# Patient Record
Sex: Female | Born: 1955
Health system: Southern US, Community
[De-identification: ages and names within clinical notes are randomized; demographics above are authoritative.]

## PROBLEM LIST (undated history)

## (undated) DIAGNOSIS — F32A Depression, unspecified: Secondary | ICD-10-CM

## (undated) DIAGNOSIS — F419 Anxiety disorder, unspecified: Secondary | ICD-10-CM

## (undated) DIAGNOSIS — I10 Essential (primary) hypertension: Secondary | ICD-10-CM

## (undated) DIAGNOSIS — E785 Hyperlipidemia, unspecified: Secondary | ICD-10-CM

## (undated) HISTORY — DX: Anxiety disorder, unspecified: F41.9

## (undated) HISTORY — PX: UTERINE FIBROID SURGERY: SHX826

## (undated) HISTORY — DX: Depression, unspecified: F32.A

## (undated) HISTORY — DX: Hyperlipidemia, unspecified: E78.5

## (undated) HISTORY — DX: Essential (primary) hypertension: I10

---

## 2019-02-16 DIAGNOSIS — R350 Frequency of micturition: Secondary | ICD-10-CM | POA: Diagnosis not present

## 2019-02-16 DIAGNOSIS — G4700 Insomnia, unspecified: Secondary | ICD-10-CM | POA: Diagnosis not present

## 2019-06-01 DIAGNOSIS — Z20828 Contact with and (suspected) exposure to other viral communicable diseases: Secondary | ICD-10-CM | POA: Diagnosis not present

## 2019-06-17 DIAGNOSIS — Z20828 Contact with and (suspected) exposure to other viral communicable diseases: Secondary | ICD-10-CM | POA: Diagnosis not present

## 2019-08-02 ENCOUNTER — Ambulatory Visit: Payer: Self-pay | Admitting: Family Medicine

## 2019-08-29 DIAGNOSIS — Z20828 Contact with and (suspected) exposure to other viral communicable diseases: Secondary | ICD-10-CM | POA: Diagnosis not present

## 2019-09-07 ENCOUNTER — Ambulatory Visit: Payer: Self-pay | Admitting: Family Medicine

## 2019-09-21 ENCOUNTER — Ambulatory Visit (INDEPENDENT_AMBULATORY_CARE_PROVIDER_SITE_OTHER): Payer: Self-pay | Admitting: Family Medicine

## 2019-09-21 DIAGNOSIS — Z5329 Procedure and treatment not carried out because of patient's decision for other reasons: Secondary | ICD-10-CM

## 2019-09-21 NOTE — Progress Notes (Deleted)
Tabita Corbo is a 63 y.o. female  No chief complaint on file.   HPI: Charnese Federici is a 63 y.o. female here to establish care with our office and for her annual CPE with fasting labs. Pt also requests medication refills.  Last PAP: *** Last mammo: *** Last colonoscopy: ***  Diet/Exercise: ***   No past medical history on file.  *** The histories are not reviewed yet. Please review them in the "History" navigator section and refresh this Bra[place].  Social History   Socioeconomic History   Marital status: Divorced    Spouse name: Not on file   Number of children: Not on file   Years of education: Not on file   Highest education level: Not on file  Occupational History   Not on file  Social Needs   Financial resource strain: Not on file   Food insecurity    Worry: Not on file    Inability: Not on file   Transportation needs    Medical: Not on file    Non-medical: Not on file  Tobacco Use   Smoking status: Not on file  Substance and Sexual Activity   Alcohol use: Not on file   Drug use: Not on file   Sexual activity: Not on file  Lifestyle   Physical activity    Days per week: Not on file    Minutes per session: Not on file   Stress: Not on file  Relationships   Social connections    Talks on phone: Not on file    Gets together: Not on file    Attends religious service: Not on file    Active member of club or organization: Not on file    Attends meetings of clubs or organizations: Not on file    Relationship status: Not on file   Intimate partner violence    Fear of current or ex partner: Not on file    Emotionally abused: Not on file    Physically abused: Not on file    Forced sexual activity: Not on file  Other Topics Concern   Not on file  Social History Narrative   Not on file    No family history on file.    There is no immunization history on file for this patient.  No outpatient encounter medications on file as of  09/21/2019.   No facility-administered encounter medications on file as of 09/21/2019.      ROS: Gen: no fever, chills  Skin: no rash, itching ENT: no ear pain, ear drainage, nasal congestion, rhinorrhea, sinus pressure, sore throat Eyes: no blurry vision, double vision Resp: no cough, wheeze,SOB Breast: no breast tenderness, no nipple discharge, no breast masses CV: no CP, palpitations, LE edema,  GI: no heartburn, n/v/d/c, abd pain GU: no dysuria, urgency, frequency, hematuria MSK: no joint pain, myalgias, back pain Neuro: no dizziness, headache, weakness, vertigo Psych: no depression, anxiety, insomnia   Not on File  There were no vitals taken for this visit.  Physical Exam  Constitutional: She is oriented to person, place, and time. She appears well-developed and well-nourished. No distress.  HENT:  Head: Normocephalic and atraumatic.  Right Ear: Tympanic membrane and ear canal normal.  Left Ear: Tympanic membrane and ear canal normal.  Nose: Nose normal.  Mouth/Throat: Oropharynx is clear and moist and mucous membranes are normal.  Eyes: Pupils are equal, round, and reactive to light. Conjunctivae are normal.  Neck: Neck supple. No thyromegaly present.  Cardiovascular: Normal  rate, regular rhythm, normal heart sounds and intact distal pulses.  No murmur heard. Pulmonary/Chest: Effort normal and breath sounds normal. No respiratory distress. She has no wheezes. She has no rhonchi.  Abdominal: Soft. Bowel sounds are normal. She exhibits no distension and no mass. There is no abdominal tenderness.  Musculoskeletal:        General: No edema.  Lymphadenopathy:    She has no cervical adenopathy.  Neurological: She is alert and oriented to person, place, and time. She exhibits normal muscle tone. Coordination normal.  Skin: Skin is warm and dry.  Psychiatric: She has a normal mood and affect. Her behavior is normal.     A/P:

## 2020-04-18 ENCOUNTER — Other Ambulatory Visit: Payer: Self-pay

## 2020-04-19 ENCOUNTER — Encounter: Payer: Self-pay | Admitting: Family Medicine

## 2020-04-19 ENCOUNTER — Ambulatory Visit: Payer: BC Managed Care – PPO | Admitting: Family Medicine

## 2020-04-19 VITALS — BP 126/72 | HR 88 | Temp 96.0°F | Ht 62.0 in | Wt 252.6 lb

## 2020-04-19 DIAGNOSIS — F418 Other specified anxiety disorders: Secondary | ICD-10-CM | POA: Insufficient documentation

## 2020-04-19 DIAGNOSIS — Z0000 Encounter for general adult medical examination without abnormal findings: Secondary | ICD-10-CM | POA: Diagnosis not present

## 2020-04-19 DIAGNOSIS — I10 Essential (primary) hypertension: Secondary | ICD-10-CM | POA: Diagnosis not present

## 2020-04-19 DIAGNOSIS — E119 Type 2 diabetes mellitus without complications: Secondary | ICD-10-CM | POA: Insufficient documentation

## 2020-04-19 DIAGNOSIS — E041 Nontoxic single thyroid nodule: Secondary | ICD-10-CM | POA: Diagnosis not present

## 2020-04-19 DIAGNOSIS — F5101 Primary insomnia: Secondary | ICD-10-CM | POA: Insufficient documentation

## 2020-04-19 DIAGNOSIS — R011 Cardiac murmur, unspecified: Secondary | ICD-10-CM | POA: Insufficient documentation

## 2020-04-19 DIAGNOSIS — E1169 Type 2 diabetes mellitus with other specified complication: Secondary | ICD-10-CM | POA: Diagnosis not present

## 2020-04-19 DIAGNOSIS — Z1159 Encounter for screening for other viral diseases: Secondary | ICD-10-CM | POA: Diagnosis not present

## 2020-04-19 DIAGNOSIS — Z794 Long term (current) use of insulin: Secondary | ICD-10-CM | POA: Diagnosis not present

## 2020-04-19 MED ORDER — METFORMIN HCL 500 MG PO TABS: 1000.0000 mg | ORAL_TABLET | Freq: Every day | ORAL | 3 refills | Status: DC

## 2020-04-19 MED ORDER — ZOLPIDEM TARTRATE ER 12.5 MG PO TBCR: 12.5000 mg | EXTENDED_RELEASE_TABLET | Freq: Every evening | ORAL | 0 refills | Status: DC | PRN

## 2020-04-19 MED ORDER — LISINOPRIL-HYDROCHLOROTHIAZIDE 20-25 MG PO TABS: 1.0000 | ORAL_TABLET | Freq: Every day | ORAL | 3 refills | Status: DC

## 2020-04-19 MED ORDER — CITALOPRAM HYDROBROMIDE 20 MG PO TABS: 20.0000 mg | ORAL_TABLET | Freq: Every day | ORAL | 1 refills | Status: DC

## 2020-04-19 MED ORDER — LANTUS SOLOSTAR 100 UNIT/ML ~~LOC~~ SOPN: 65.0000 [IU] | PEN_INJECTOR | Freq: Every day | SUBCUTANEOUS | 5 refills | Status: DC

## 2020-04-19 MED ORDER — OZEMPIC (0.25 OR 0.5 MG/DOSE) 2 MG/1.5ML ~~LOC~~ SOPN: 1.0000 mg | PEN_INJECTOR | SUBCUTANEOUS | 3 refills | Status: DC

## 2020-04-19 NOTE — Progress Notes (Signed)
New Patient Office Visit  Subjective:  Patient ID: Jillian Fowler, female    DOB: 1956/04/16  Age: 64 y.o. MRN: [id number]  CC:  Chief Complaint  Patient presents with  . Establish Care    New patient, right ear pain x 1-2 weeks, would like BP and diabetes follwed.     HPI [name] presents for establishment of care and follow-up of her type 2 diabetes, hypertension, morbid obesity, depression with anxiety and insomnia.  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  Citalopram is helping her depression and anxiety a great deal.  She uses Ambien only as needed at night.  Uses it sparingly.  Less than 30 doses in the last several months.  Past Medical History:  Diagnosis Date  . Anxiety   . Depression   . Hyperlipidemia   . Hypertension     Past Surgical History:  Procedure Laterality Date  . UTERINE FIBROID SURGERY      Family History  Problem Relation Age of Onset  . Hypertension Mother   . Diabetes Mother   . Arthritis Mother   . Hypertension Father   . Diabetes Father     Social History   Socioeconomic History  . Marital status: Divorced    Spouse name: Not on file  . Number of children: Not on file  . Years of education: Not on file  . Highest education level: Not on file  Occupational History  . Not on file  Tobacco Use  . Smoking status: Never Smoker  . Smokeless tobacco: Never Used  Vaping Use  . Vaping Use: Never used  Substance and Sexual Activity  . Alcohol use: Never  . Drug use: Never  . Sexual activity: Not Currently  Other Topics Concern  . Not on file  Social History Narrative  . Not on file   Social Determinants of Health   Financial Resource Strain:   . Difficulty of Paying Living Expenses:   Food Insecurity:   . Worried About Charity fundraiser in the Last Year:   . Arboriculturist in the Last Year:   Transportation Needs:   . Film/video editor (Medical):   Marland Kitchen Lack of  Transportation (Non-Medical):   Physical Activity:   . Days of Exercise per Week:   . Minutes of Exercise per Session:   Stress:   . Feeling of Stress :   Social Connections:   . Frequency of Communication with Friends and Family:   . Frequency of Social Gatherings with Friends and Family:   . Attends Religious Services:   . Active Member of Clubs or Organizations:   . Attends Archivist Meetings:   Marland Kitchen Marital Status:   Intimate Partner Violence:   . Fear of Current or Ex-Partner:   . Emotionally Abused:   Marland Kitchen Physically Abused:   . Sexually Abused:     ROS Review of Systems  Constitutional: Negative.   HENT: Negative.   Eyes: Negative for photophobia and visual disturbance.  Respiratory: Negative.   Cardiovascular: Negative.   Gastrointestinal: Negative.   Endocrine: Negative for polyphagia and polyuria.  Genitourinary: Negative.   Musculoskeletal: Negative for gait problem and joint swelling.  Skin: Negative for pallor and rash.  Allergic/Immunologic: Negative for immunocompromised state.  Neurological: Negative for light-headedness and numbness.  Hematological: Does not bruise/bleed easily.   Depression screen [facility] 2/9 04/19/2020 04/19/2020  Decreased Interest 0 0  Down, Depressed, Hopeless 1  1  PHQ - 2 Score 1 1  Altered sleeping 1 -  Tired, decreased energy 3 -  Change in appetite 0 -  Feeling bad or failure about yourself  3 -  Trouble concentrating 0 -  Moving slowly or fidgety/restless 0 -  Suicidal thoughts 1 -  PHQ-9 Score 9 -    Objective:   Today's Vitals: BP 126/72   Pulse 88   Temp (!) 96 F (35.6 C) (Tympanic)   Ht 5\' 2"  (1.575 m)   Wt 252 lb 9.6 oz (114.6 kg)   SpO2 94%   BMI 46.20 kg/m   Physical Exam Vitals and nursing note reviewed.  Constitutional:      General: She is not in acute distress.    Appearance: Normal appearance. She is obese. She is not ill-appearing, toxic-appearing or diaphoretic.  HENT:     Head: Normocephalic  and atraumatic.     Right Ear: Tympanic membrane, ear canal and external ear normal.     Left Ear: Tympanic membrane, ear canal and external ear normal.     Mouth/Throat:     Mouth: Mucous membranes are moist.     Pharynx: No oropharyngeal exudate or posterior oropharyngeal erythema.  Eyes:     General: No scleral icterus.       Right eye: No discharge.        Left eye: No discharge.     Extraocular Movements: Extraocular movements intact.     Conjunctiva/sclera: Conjunctivae normal.     Pupils: Pupils are equal, round, and reactive to light.  Neck:     Thyroid: Thyroid mass present.  Cardiovascular:     Rate and Rhythm: Normal rate and regular rhythm.     Pulses:          Dorsalis pedis pulses are 2+ on the right side and 2+ on the left side.       Posterior tibial pulses are 2+ on the right side and 2+ on the left side.     Heart sounds: Murmur heard.  Systolic murmur is present with a grade of 2/6.   Pulmonary:     Effort: Pulmonary effort is normal.     Breath sounds: Normal breath sounds.  Abdominal:     General: Bowel sounds are normal.  Musculoskeletal:     Cervical back: No rigidity or tenderness.  Lymphadenopathy:     Cervical: No cervical adenopathy.  Skin:    General: Skin is warm and dry.  Neurological:     Mental Status: She is alert and oriented to person, place, and time.  Psychiatric:        Mood and Affect: Mood normal.        Behavior: Behavior normal.    Diabetic Foot Exam - Simple   Simple Foot Form Diabetic Foot exam was performed with the following findings: Yes 04/19/2020  4:41 PM  Visual Inspection No deformities, no ulcerations, no other skin breakdown bilaterally: Yes Sensation Testing Intact to touch and monofilament testing bilaterally: Yes Pulse Check Posterior Tibialis and Dorsalis pulse intact bilaterally: Yes Comments     Assessment & Plan:   Problem List Items Addressed This Visit      Cardiovascular and Mediastinum    Essential hypertension - Primary   Relevant Medications   lisinopril-hydrochlorothiazide (ZESTORETIC) 20-25 MG tablet   Other Relevant Orders   CBC   Comprehensive metabolic panel   Urinalysis, Routine w reflex microscopic   Microalbumin / creatinine urine ratio  Endocrine   Diabetes mellitus (HCC)   Relevant Medications   LANTUS SOLOSTAR 100 UNIT/ML Solostar Pen   lisinopril-hydrochlorothiazide (ZESTORETIC) 20-25 MG tablet   metFORMIN (GLUCOPHAGE) 500 MG tablet   OZEMPIC, 0.25 OR 0.5 MG/DOSE, 2 MG/1.5ML SOPN   Other Relevant Orders   Hemoglobin A1c   Urinalysis, Routine w reflex microscopic   Microalbumin / creatinine urine ratio   Ambulatory referral to Ophthalmology   Thyroid nodule   Relevant Orders   TSH   US THYROID     Other   Morbid obesity (HCC)   Relevant Medications   LANTUS SOLOSTAR 100 UNIT/ML Solostar Pen   metFORMIN (GLUCOPHAGE) 500 MG tablet   OZEMPIC, 0.25 OR 0.5 MG/DOSE, 2 MG/1.5ML SOPN   Other Relevant Orders   VITAMIN D 25 Hydroxy (Vit-D Deficiency, Fractures)   Amb Ref to Medical Weight Management   Heart murmur   Relevant Orders   Ambulatory referral to Cardiology   Depression with anxiety   Relevant Medications   citalopram (CELEXA) 20 MG tablet   Primary insomnia   Relevant Medications   zolpidem (AMBIEN CR) 12.5 MG CR tablet    Other Visit Diagnoses    Healthcare maintenance       Relevant Orders   LDL cholesterol, direct   Hepatitis C antibody   HIV Antibody (routine testing w rflx)   Lipid panel      Outpatient Encounter Medications as of 04/19/2020  Medication Sig  . citalopram (CELEXA) 20 MG tablet Take 1 tablet (20 mg total) by mouth daily.  Marland Kitchen LANTUS SOLOSTAR 100 UNIT/ML Solostar Pen Inject 65 Units into the skin at bedtime.  Marland Kitchen lisinopril-hydrochlorothiazide (ZESTORETIC) 20-25 MG tablet Take 1 tablet by mouth daily.  . metFORMIN (GLUCOPHAGE) 500 MG tablet Take 2 tablets (1,000 mg total) by mouth daily.  Marland Kitchen OZEMPIC, 0.25 OR  0.5 MG/DOSE, 2 MG/1.5ML SOPN Inject 0.75 mLs (1 mg total) into the skin once a week.  . zolpidem (AMBIEN CR) 12.5 MG CR tablet Take 1 tablet (12.5 mg total) by mouth at bedtime as needed for sleep.  . [DISCONTINUED] citalopram (CELEXA) 20 MG tablet Take 20 mg by mouth daily.  . [DISCONTINUED] LANTUS SOLOSTAR 100 UNIT/ML Solostar Pen Inject into the skin.  . [DISCONTINUED] lisinopril-hydrochlorothiazide (ZESTORETIC) 20-25 MG tablet Take 1 tablet by mouth daily.  . [DISCONTINUED] metFORMIN (GLUCOPHAGE) 500 MG tablet Take 1,000 mg by mouth daily.  . [DISCONTINUED] OZEMPIC, 0.25 OR 0.5 MG/DOSE, 2 MG/1.5ML SOPN Inject 0.5 mg into the skin once a week.  . [DISCONTINUED] zolpidem (AMBIEN CR) 12.5 MG CR tablet Take 12.5 mg by mouth at bedtime.   No facility-administered encounter medications on file as of 04/19/2020.    Follow-up: Return in about 3 months (around 07/20/2020).   Mliss Sax, MD

## 2020-04-22 LAB — URINALYSIS, ROUTINE W REFLEX MICROSCOPIC
Bilirubin Urine: NEGATIVE
Glucose, UA: NEGATIVE
Hgb urine dipstick: NEGATIVE
Ketones, ur: NEGATIVE
Leukocytes,Ua: NEGATIVE
Nitrite: NEGATIVE
Protein, ur: NEGATIVE
Specific Gravity, Urine: 1.02 (ref 1.001–1.03)
pH: 6 (ref 5.0–8.0)

## 2020-04-22 LAB — LIPID PANEL
Cholesterol: 246 mg/dL — ABNORMAL HIGH (ref <200)
HDL: 43 mg/dL — ABNORMAL LOW (ref >=50)
LDL Cholesterol (Calc): 173 mg/dL (calc) — ABNORMAL HIGH
Non-HDL Cholesterol (Calc): 203 mg/dL (calc) — ABNORMAL HIGH (ref <130)
Total CHOL/HDL Ratio: 5.7 (calc) — ABNORMAL HIGH (ref <5.0)
Triglycerides: 156 mg/dL — ABNORMAL HIGH (ref <150)

## 2020-04-22 LAB — CBC
HCT: 39 % (ref 35.0–45.0)
Hemoglobin: 12.6 g/dL (ref 11.7–15.5)
MCH: 23.1 pg — ABNORMAL LOW (ref 27.0–33.0)
MCHC: 32.3 g/dL (ref 32.0–36.0)
MCV: 71.6 fL — ABNORMAL LOW (ref 80.0–100.0)
MPV: 10.9 fL (ref 7.5–12.5)
Platelets: 268 10*3/uL (ref 140–400)
RBC: 5.45 10*6/uL — ABNORMAL HIGH (ref 3.80–5.10)
RDW: 14.2 % (ref 11.0–15.0)
WBC: 11.3 10*3/uL — ABNORMAL HIGH (ref 3.8–10.8)

## 2020-04-22 LAB — COMPREHENSIVE METABOLIC PANEL
AG Ratio: 1.7 (calc) (ref 1.0–2.5)
ALT: 13 U/L (ref 6–29)
AST: 12 U/L (ref 10–35)
Albumin: 4.1 g/dL (ref 3.6–5.1)
Alkaline phosphatase (APISO): 62 U/L (ref 37–153)
BUN/Creatinine Ratio: 20 (calc) (ref 6–22)
BUN: 20 mg/dL (ref 7–25)
CO2: 28 mmol/L (ref 20–32)
Calcium: 9.5 mg/dL (ref 8.6–10.4)
Chloride: 101 mmol/L (ref 98–110)
Creat: 1.01 mg/dL — ABNORMAL HIGH (ref 0.50–0.99)
Globulin: 2.4 g/dL (calc) (ref 1.9–3.7)
Glucose, Bld: 94 mg/dL (ref 65–99)
Potassium: 4.3 mmol/L (ref 3.5–5.3)
Sodium: 139 mmol/L (ref 135–146)
Total Bilirubin: 0.3 mg/dL (ref 0.2–1.2)
Total Protein: 6.5 g/dL (ref 6.1–8.1)

## 2020-04-22 LAB — HEMOGLOBIN A1C
Hgb A1c MFr Bld: 7.7 % of total Hgb — ABNORMAL HIGH (ref <5.7)
Mean Plasma Glucose: 174 (calc)
eAG (mmol/L): 9.7 (calc)

## 2020-04-22 LAB — HIV ANTIBODY (ROUTINE TESTING W REFLEX): HIV 1&2 Ab, 4th Generation: NONREACTIVE

## 2020-04-22 LAB — HEPATITIS C ANTIBODY
Hepatitis C Ab: NONREACTIVE
SIGNAL TO CUT-OFF: 0.01 (ref <1.00)

## 2020-04-22 LAB — VITAMIN D 25 HYDROXY (VIT D DEFICIENCY, FRACTURES): Vit D, 25-Hydroxy: 20 ng/mL — ABNORMAL LOW (ref 30–100)

## 2020-04-22 LAB — MICROALBUMIN / CREATININE URINE RATIO
Creatinine, Urine: 130 mg/dL (ref 20–275)
Microalb Creat Ratio: 6 mcg/mg creat (ref <30)
Microalb, Ur: 0.8 mg/dL

## 2020-04-22 LAB — TSH: TSH: 1.58 mIU/L (ref 0.40–4.50)

## 2020-04-22 LAB — LDL CHOLESTEROL, DIRECT: Direct LDL: 178 mg/dL — ABNORMAL HIGH (ref <100)

## 2020-04-29 ENCOUNTER — Encounter: Payer: Self-pay | Admitting: General Practice

## 2020-05-10 ENCOUNTER — Telehealth: Payer: Self-pay | Admitting: Family Medicine

## 2020-05-10 DIAGNOSIS — E1169 Type 2 diabetes mellitus with other specified complication: Secondary | ICD-10-CM

## 2020-05-10 NOTE — Telephone Encounter (Signed)
Patient is calling and wanted to speak to someone regarding dosage for ozempic, please advise. CB is [phone]

## 2020-05-15 ENCOUNTER — Other Ambulatory Visit: Payer: Self-pay

## 2020-05-15 DIAGNOSIS — Z794 Long term (current) use of insulin: Secondary | ICD-10-CM

## 2020-05-15 DIAGNOSIS — E1169 Type 2 diabetes mellitus with other specified complication: Secondary | ICD-10-CM

## 2020-05-15 NOTE — Telephone Encounter (Signed)
Patient calling states that she could not pick up Ozempic. I called and spoke with pharmacy and they informed me that the Rx needed to be 91ml instead of the dose that was sent in. I tried to change but was not able to do so. Please advise

## 2020-05-16 MED ORDER — OZEMPIC (0.25 OR 0.5 MG/DOSE) 2 MG/1.5ML ~~LOC~~ SOPN: 1.0000 mg | PEN_INJECTOR | SUBCUTANEOUS | 3 refills | Status: DC

## 2020-05-23 ENCOUNTER — Telehealth: Payer: Self-pay | Admitting: Family Medicine

## 2020-05-23 MED ORDER — OZEMPIC (1 MG/DOSE) 4 MG/3ML ~~LOC~~ SOPN: 1.0000 mg | PEN_INJECTOR | SUBCUTANEOUS | 3 refills | Status: DC

## 2020-05-23 NOTE — Telephone Encounter (Signed)
Per PCP - okay to change rx for the 98ml pen per pharmacy request. erx has been sent for 90 day and 3 refills.   Pt will continue (as previously prescribed) the 1 mg per week injection.

## 2020-05-23 NOTE — Telephone Encounter (Signed)
Patient states [facility] told her they no longer make Ozempic in the dosage prescribed. She is unaware what dosages they do carry. Please call pharmacy to clarify. [facility] on [address].

## 2020-05-28 ENCOUNTER — Other Ambulatory Visit: Payer: Self-pay

## 2020-05-28 ENCOUNTER — Ambulatory Visit (HOSPITAL_BASED_OUTPATIENT_CLINIC_OR_DEPARTMENT_OTHER)
Admission: RE | Admit: 2020-05-28 | Discharge: 2020-05-28 | Disposition: A | Discharge status: Home / Self Care | Payer: BC Managed Care – PPO | Source: Ambulatory Visit | Attending: Family Medicine | Admitting: Family Medicine

## 2020-05-28 DIAGNOSIS — E042 Nontoxic multinodular goiter: Secondary | ICD-10-CM | POA: Diagnosis not present

## 2020-05-28 DIAGNOSIS — E041 Nontoxic single thyroid nodule: Secondary | ICD-10-CM | POA: Diagnosis not present

## 2020-08-06 DIAGNOSIS — E119 Type 2 diabetes mellitus without complications: Secondary | ICD-10-CM | POA: Diagnosis not present

## 2020-08-20 DIAGNOSIS — H25812 Combined forms of age-related cataract, left eye: Secondary | ICD-10-CM | POA: Diagnosis not present

## 2020-10-25 ENCOUNTER — Other Ambulatory Visit: Payer: Self-pay | Admitting: Family Medicine

## 2020-10-25 DIAGNOSIS — Z794 Long term (current) use of insulin: Secondary | ICD-10-CM

## 2020-10-25 DIAGNOSIS — E1169 Type 2 diabetes mellitus with other specified complication: Secondary | ICD-10-CM

## 2020-11-19 ENCOUNTER — Other Ambulatory Visit: Payer: Self-pay | Admitting: Family Medicine

## 2020-11-19 ENCOUNTER — Other Ambulatory Visit: Payer: Self-pay | Admitting: Family

## 2020-11-19 DIAGNOSIS — Z794 Long term (current) use of insulin: Secondary | ICD-10-CM

## 2020-11-19 DIAGNOSIS — E1169 Type 2 diabetes mellitus with other specified complication: Secondary | ICD-10-CM

## 2020-11-19 DIAGNOSIS — F418 Other specified anxiety disorders: Secondary | ICD-10-CM

## 2020-11-22 ENCOUNTER — Telehealth: Payer: Self-pay

## 2020-11-22 NOTE — Telephone Encounter (Signed)
Patient calling to inform the provider that her insurance no longer pays for her insulin pen, Lantus and she called the pharmacy and she said that she was told that her insurance will pay for the Novolog flex pen.  Please send in the new rx.   Pt does not want to stick her finger everyday according to the insulin.  Please advise.  CB#[phone]

## 2020-11-22 NOTE — Telephone Encounter (Signed)
Patient called back and said that she found a coupon for Lantus and that she was going to try that first and see how much it it.  Pt will call back to let office know if she still would like to switch.

## 2020-12-04 ENCOUNTER — Telehealth: Payer: Self-pay

## 2020-12-04 MED ORDER — TOUJEO SOLOSTAR 300 UNIT/ML ~~LOC~~ SOPN: 65.0000 [IU] | PEN_INJECTOR | Freq: Every evening | SUBCUTANEOUS | 5 refills | Status: DC

## 2020-12-04 NOTE — Telephone Encounter (Signed)
Patient calling to request that her medication be changed due to  Insurance.  They will not pay for the Lantus nor the Basaglar.  Pt said that insurance will pay for the [facility] and wanted to know if it would be ok to switch insulins and if so to please send in.  I informed pt that Dr. Doreene Burke is not in office until March.  I will send message to provider of the day.  Please advise. CB# [phone]

## 2020-12-04 NOTE — Telephone Encounter (Signed)
New Rx sent to pharm on file

## 2020-12-04 NOTE — Telephone Encounter (Signed)
VM full

## 2020-12-23 ENCOUNTER — Other Ambulatory Visit: Payer: Self-pay | Admitting: Family

## 2020-12-23 DIAGNOSIS — F418 Other specified anxiety disorders: Secondary | ICD-10-CM

## 2020-12-30 ENCOUNTER — Other Ambulatory Visit: Payer: Self-pay | Admitting: Family Medicine

## 2020-12-30 DIAGNOSIS — Z1231 Encounter for screening mammogram for malignant neoplasm of breast: Secondary | ICD-10-CM

## 2020-12-31 HISTORY — PX: EYE SURGERY: SHX253

## 2021-02-11 ENCOUNTER — Other Ambulatory Visit: Payer: Self-pay

## 2021-02-11 ENCOUNTER — Ambulatory Visit
Admission: RE | Admit: 2021-02-11 | Discharge: 2021-02-11 | Disposition: A | Discharge status: Home / Self Care | Payer: BC Managed Care – PPO | Source: Ambulatory Visit

## 2021-02-11 DIAGNOSIS — Z1231 Encounter for screening mammogram for malignant neoplasm of breast: Secondary | ICD-10-CM

## 2021-03-14 ENCOUNTER — Other Ambulatory Visit: Payer: Self-pay | Admitting: Surgical Oncology

## 2021-03-14 DIAGNOSIS — K219 Gastro-esophageal reflux disease without esophagitis: Secondary | ICD-10-CM

## 2021-03-27 ENCOUNTER — Encounter: Payer: Self-pay | Admitting: Family Medicine

## 2021-03-27 ENCOUNTER — Ambulatory Visit (INDEPENDENT_AMBULATORY_CARE_PROVIDER_SITE_OTHER): Payer: No Typology Code available for payment source | Admitting: Family Medicine

## 2021-03-27 ENCOUNTER — Other Ambulatory Visit: Payer: Self-pay

## 2021-03-27 VITALS — BP 132/68 | HR 78 | Temp 97.0°F | Ht 62.0 in | Wt 252.2 lb

## 2021-03-27 DIAGNOSIS — E1169 Type 2 diabetes mellitus with other specified complication: Secondary | ICD-10-CM | POA: Diagnosis not present

## 2021-03-27 DIAGNOSIS — R011 Cardiac murmur, unspecified: Secondary | ICD-10-CM

## 2021-03-27 DIAGNOSIS — Z0000 Encounter for general adult medical examination without abnormal findings: Secondary | ICD-10-CM | POA: Diagnosis not present

## 2021-03-27 DIAGNOSIS — Z794 Long term (current) use of insulin: Secondary | ICD-10-CM

## 2021-03-27 DIAGNOSIS — F5101 Primary insomnia: Secondary | ICD-10-CM

## 2021-03-27 DIAGNOSIS — I10 Essential (primary) hypertension: Secondary | ICD-10-CM | POA: Diagnosis not present

## 2021-03-27 DIAGNOSIS — E7800 Pure hypercholesterolemia, unspecified: Secondary | ICD-10-CM

## 2021-03-27 NOTE — Progress Notes (Addendum)
Established Patient Office Visit  Subjective:  Patient ID: Jillian Fowler, female    DOB: 01-Jun-1956  Age: 65 y.o. MRN: [id number]  CC:  Chief Complaint  Patient presents with  . Annual Exam    CPE, no concerns. Not fasting.     HPI Jillian Fowler presents for follow-up of hypertension and diabetes.  Currently up to 65 units of Lantus nightly.  Doing well with the semaglutide.  Continues with high-dose metformin without any issue.  Status post dilated eye exam 1 March this year with cataract extraction later that month.  Blood pressures well controlled with lisinopril with HCTZ.  Depression controlled with Celexa.  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  Using Ambien sparingly.  Status post mammogram.  She is finally scheduled for an echocardiogram next week.  She has yet to schedule her Pap smear.  Has never had a colonoscopy.  Her big news is is that bariatric surgery is scheduled for her in August.  Past Medical History:  Diagnosis Date  . Anxiety   . Depression   . Hyperlipidemia   . Hypertension     Past Surgical History:  Procedure Laterality Date  . UTERINE FIBROID SURGERY      Family History  Problem Relation Age of Onset  . Hypertension Mother   . Diabetes Mother   . Arthritis Mother   . Hypertension Father   . Diabetes Father     Social History   Socioeconomic History  . Marital status: Divorced    Spouse name: Not on file  . Number of children: Not on file  . Years of education: Not on file  . Highest education level: Not on file  Occupational History  . Not on file  Tobacco Use  . Smoking status: Never Smoker  . Smokeless tobacco: Never Used  Vaping Use  . Vaping Use: Never used  Substance and Sexual Activity  . Alcohol use: Never  . Drug use: Never  . Sexual activity: Not Currently  Other Topics Concern  . Not on file  Social History Narrative  . Not on file    Social Determinants of Health   Financial Resource Strain: Not on file  Food Insecurity: Not on file  Transportation Needs: Not on file  Physical Activity: Not on file  Stress: Not on file  Social Connections: Not on file  Intimate Partner Violence: Not on file    Outpatient Medications Prior to Visit  Medication Sig Dispense Refill  . citalopram (CELEXA) 20 MG tablet Take 1 tablet by mouth once daily 90 tablet 0  . insulin glargine, 1 Unit Dial, (TOUJEO SOLOSTAR) 300 UNIT/ML Solostar Pen Inject 65 Units into the skin at bedtime. 1.5 mL 5  . metFORMIN (GLUCOPHAGE) 500 MG tablet Take 2 tablets (1,000 mg total) by mouth daily. 360 tablet 3  . lisinopril-hydrochlorothiazide (ZESTORETIC) 20-25 MG tablet Take 1 tablet by mouth daily. 90 tablet 3  . zolpidem (AMBIEN CR) 12.5 MG CR tablet Take 1 tablet (12.5 mg total) by mouth at bedtime as needed for sleep. (Patient not taking: Reported on 03/27/2021) 30 tablet 0  . Semaglutide, 1 MG/DOSE, (OZEMPIC, 1 MG/DOSE,) 4 MG/3ML SOPN Inject 0.75 mLs (1 mg total) into the skin once a week. (Patient not taking: Reported on 03/27/2021) 9 mL 3   No facility-administered medications prior to visit.    Not on File  ROS Review of Systems  Constitutional: Negative.  HENT: Negative.   Eyes: Negative for photophobia and visual disturbance.  Respiratory: Negative.   Cardiovascular: Negative.   Gastrointestinal: Negative.   Endocrine: Negative for polyphagia and polyuria.  Genitourinary: Negative.   Musculoskeletal: Negative for gait problem.  Neurological: Negative for weakness and numbness.      Objective:    Physical Exam Vitals and nursing note reviewed.  Constitutional:      General: She is not in acute distress.    Appearance: Normal appearance. She is obese. She is not ill-appearing, toxic-appearing or diaphoretic.  HENT:     Head: Normocephalic and atraumatic.     Right Ear: Tympanic membrane, ear canal and external ear normal.      Left Ear: Tympanic membrane, ear canal and external ear normal.     Mouth/Throat:     Mouth: Mucous membranes are moist.     Pharynx: No oropharyngeal exudate or posterior oropharyngeal erythema.  Eyes:     General: No scleral icterus.       Right eye: No discharge.        Left eye: No discharge.     Extraocular Movements: Extraocular movements intact.     Conjunctiva/sclera: Conjunctivae normal.     Pupils: Pupils are equal, round, and reactive to light.  Neck:     Vascular: No carotid bruit.  Cardiovascular:     Rate and Rhythm: Normal rate and regular rhythm.     Pulses:          Dorsalis pedis pulses are 2+ on the right side and 2+ on the left side.       Posterior tibial pulses are 2+ on the right side and 2+ on the left side.     Heart sounds: Murmur heard.    Pulmonary:     Effort: Pulmonary effort is normal.     Breath sounds: Normal breath sounds.  Abdominal:     General: Bowel sounds are normal.  Musculoskeletal:     Cervical back: No rigidity or tenderness.     Right lower leg: No edema.     Left lower leg: No edema.  Lymphadenopathy:     Cervical: No cervical adenopathy.  Skin:    General: Skin is warm and dry.  Neurological:     Mental Status: She is alert and oriented to person, place, and time.  Psychiatric:        Mood and Affect: Mood normal.        Behavior: Behavior normal.     Diabetic Foot Exam - Simple   Simple Foot Form Diabetic Foot exam was performed with the following findings: Yes 03/27/2021  3:37 PM  Visual Inspection No deformities, no ulcerations, no other skin breakdown bilaterally: Yes Sensation Testing Intact to touch and monofilament testing bilaterally: Yes Pulse Check Posterior Tibialis and Dorsalis pulse intact bilaterally: Yes Comments     BP 132/68   Pulse 78   Temp (!) 97 F (36.1 C) (Temporal)   Ht 5\' 2"  (1.575 m)   Wt 252 lb 3.2 oz (114.4 kg)   SpO2 97%   BMI 46.13 kg/m  Wt Readings from Last 3 Encounters:   03/27/21 252 lb 3.2 oz (114.4 kg)  04/19/20 252 lb 9.6 oz (114.6 kg)     Health Maintenance Due  Topic Date Due  . PNEUMOCOCCAL POLYSACCHARIDE VACCINE AGE 20-64 HIGH RISK  Never done  . Pneumococcal Vaccine 50-95 Years old (1 of 2 - PPSV23) Never done  . TETANUS/TDAP  Never done  .  PAP SMEAR-Modifier  Never done  . Zoster Vaccines- Shingrix (1 of 2) Never done    There are no preventive care reminders to display for this patient.  Lab Results  Component Value Date   TSH 1.58 04/19/2020   Lab Results  Component Value Date   WBC 9.2 04/04/2021   HGB 12.5 04/04/2021   HCT 38.8 04/04/2021   MCV 71.9 (L) 04/04/2021   PLT 222.0 04/04/2021   Lab Results  Component Value Date   NA 139 04/04/2021   K 4.0 04/04/2021   CO2 27 04/04/2021   GLUCOSE 103 (H) 04/04/2021   BUN 21 04/04/2021   CREATININE 0.76 04/04/2021   BILITOT 0.4 04/04/2021   ALKPHOS 57 04/04/2021   AST 12 04/04/2021   ALT 11 04/04/2021   PROT 6.6 04/04/2021   ALBUMIN 4.0 04/04/2021   CALCIUM 9.3 04/04/2021   GFR 83.37 04/04/2021   Lab Results  Component Value Date   CHOL 247 (H) 04/04/2021   Lab Results  Component Value Date   HDL 42.40 04/04/2021   Lab Results  Component Value Date   LDLCALC 182 (H) 04/04/2021   Lab Results  Component Value Date   TRIG 114.0 04/04/2021   Lab Results  Component Value Date   CHOLHDL 6 04/04/2021   Lab Results  Component Value Date   HGBA1C 8.8 (H) 04/04/2021      Assessment & Plan:   Problem List Items Addressed This Visit      Cardiovascular and Mediastinum   Essential hypertension   Relevant Medications   rosuvastatin (CRESTOR) 20 MG tablet   Other Relevant Orders   Basic metabolic panel (Completed)   Urinalysis, Routine w reflex microscopic (Completed)   Microalbumin / creatinine urine ratio (Completed)     Endocrine   Diabetes mellitus (HCC) - Primary   Relevant Medications   rosuvastatin (CRESTOR) 20 MG tablet   sitaGLIPtin (JANUVIA) 50  MG tablet   Other Relevant Orders   Basic metabolic panel (Completed)   Hemoglobin A1c (Completed)   Urinalysis, Routine w reflex microscopic (Completed)   Microalbumin / creatinine urine ratio (Completed)     Other   Morbid obesity (HCC)   Relevant Medications   sitaGLIPtin (JANUVIA) 50 MG tablet   Heart murmur   Primary insomnia    Other Visit Diagnoses    Healthcare maintenance       Relevant Orders   CBC (Completed)   LDL cholesterol, direct (Completed)   Hepatic function panel (Completed)   Lipid panel (Completed)   Elevated LDL cholesterol level       Relevant Medications   rosuvastatin (CRESTOR) 20 MG tablet      Meds ordered this encounter  Medications  . rosuvastatin (CRESTOR) 20 MG tablet    Sig: Take 1 tablet (20 mg total) by mouth daily.    Dispense:  90 tablet    Refill:  3  . sitaGLIPtin (JANUVIA) 50 MG tablet    Sig: Take 1 tablet (50 mg total) by mouth daily.    Dispense:  90 tablet    Refill:  1    Follow-up: Return in about 3 months (around 06/27/2021), or return fasting for blood work. Good luck with bariatric surgery..   Continue current medicines as above.  She will return fasting for ordered blood work.  Wished her luck with upcoming bariatric surgery.  She is still due for a Pap smear and said that she would follow-up with a GYN  provider for that exam.  Given information on health maintenance and disease prevention.  Advised that I was looking forward to the weight that she will most likely lose along with some of her medicines as well. Mliss SaxWilliam Alfred Kashawn Manzano, MD   6/6 addendum: LDL cholesterol is significantly elevated and her diabetes is under poor control.  Have added Crestor 20 mg and Januvia 50 mg.  Advised to report any unusual muscle aches or pains.  Advised to return in 3 months instead of 6.

## 2021-03-27 NOTE — Patient Instructions (Signed)
Health Maintenance, Female Adopting a healthy lifestyle and getting preventive care are important in promoting health and wellness. Ask your health care provider about:  The right schedule for you to have regular tests and exams.  Things you can do on your own to prevent diseases and keep yourself healthy. What should I know about diet, weight, and exercise? Eat a healthy diet  Eat a diet that includes plenty of vegetables, fruits, low-fat dairy products, and lean protein.  Do not eat a lot of foods that are high in solid fats, added sugars, or sodium.   Maintain a healthy weight Body mass index (BMI) is used to identify weight problems. It estimates body fat based on height and weight. Your health care provider can help determine your BMI and help you achieve or maintain a healthy weight. Get regular exercise Get regular exercise. This is one of the most important things you can do for your health. Most adults should:  Exercise for at least 150 minutes each week. The exercise should increase your heart rate and make you sweat (moderate-intensity exercise).  Do strengthening exercises at least twice a week. This is in addition to the moderate-intensity exercise.  Spend less time sitting. Even light physical activity can be beneficial. Watch cholesterol and blood lipids Have your blood tested for lipids and cholesterol at 64 years of age, then have this test every 5 years. Have your cholesterol levels checked more often if:  Your lipid or cholesterol levels are high.  You are older than 64 years of age.  You are at high risk for heart disease. What should I know about cancer screening? Depending on your health history and family history, you may need to have cancer screening at various ages. This may include screening for:  Breast cancer.  Cervical cancer.  Colorectal cancer.  Skin cancer.  Lung cancer. What should I know about heart disease, diabetes, and high blood  pressure? Blood pressure and heart disease  High blood pressure causes heart disease and increases the risk of stroke. This is more likely to develop in people who have high blood pressure readings, are of African descent, or are overweight.  Have your blood pressure checked: ? Every 3-5 years if you are 64 years of age. ? Every year if you are 64 years old or older. Diabetes Have regular diabetes screenings. This checks your fasting blood sugar level. Have the screening done:  Once every three years after age 64 if you are at a normal weight and have a low risk for diabetes.  More often and at a younger age if you are overweight or have a high risk for diabetes. What should I know about preventing infection? Hepatitis B If you have a higher risk for hepatitis B, you should be screened for this virus. Talk with your health care provider to find out if you are at risk for hepatitis B infection. Hepatitis C Testing is recommended for:  Everyone born from [birth year] through 1964.  Anyone with known risk factors for hepatitis C. Sexually transmitted infections (STIs)  Get screened for STIs, including gonorrhea and chlamydia, if: ? You are sexually active and are younger than 64 years of age. ? You are older than 64 years of age and your health care provider tells you that you are at risk for this type of infection. ? Your sexual activity has changed since you were last screened, and you are at increased risk for chlamydia or gonorrhea. Ask your health care provider  if you are at risk.  Ask your health care provider about whether you are at high risk for HIV. Your health care provider may recommend a prescription medicine to help prevent HIV infection. If you choose to take medicine to prevent HIV, you should first get tested for HIV. You should then be tested every 3 months for as long as you are taking the medicine. Pregnancy  If you are about to stop having your period (premenopausal) and  you may become pregnant, seek counseling before you get pregnant.  Take 400 to 800 micrograms (mcg) of folic acid every day if you become pregnant.  Ask for birth control (contraception) if you want to prevent pregnancy. Osteoporosis and menopause Osteoporosis is a disease in which the bones lose minerals and strength with aging. This can result in bone fractures. If you are 64 years old or older, or if you are at risk for osteoporosis and fractures, ask your health care provider if you should:  Be screened for bone loss.  Take a calcium or vitamin D supplement to lower your risk of fractures.  Be given hormone replacement therapy (HRT) to treat symptoms of menopause. Follow these instructions at home: Lifestyle  Do not use any products that contain nicotine or tobacco, such as cigarettes, e-cigarettes, and chewing tobacco. If you need help quitting, ask your health care provider.  Do not use street drugs.  Do not share needles.  Ask your health care provider for help if you need support or information about quitting drugs. Alcohol use  Do not drink alcohol if: ? Your health care provider tells you not to drink. ? You are pregnant, may be pregnant, or are planning to become pregnant.  If you drink alcohol: ? Limit how much you use to 0-1 drink a day. ? Limit intake if you are breastfeeding.  Be aware of how much alcohol is in your drink. In the [place] one drink equals one 12 oz bottle of beer (355 mL), one 5 oz glass of wine (148 mL), or one 1 oz glass of hard liquor (44 mL). General instructions  Schedule regular health, dental, and eye exams.  Stay current with your vaccines.  Tell your health care provider if: ? You often feel depressed. ? You have ever been abused or do not feel safe at home. Summary  Adopting a healthy lifestyle and getting preventive care are important in promoting health and wellness.  Follow your health care provider's instructions about healthy  diet, exercising, and getting tested or screened for diseases.  Follow your health care provider's instructions on monitoring your cholesterol and blood pressure. This information is not intended to replace advice given to you by your health care provider. Make sure you discuss any questions you have with your health care provider. Document Revised: 10/12/2018 Document Reviewed: 10/12/2018 Elsevier Patient Education  2021 [employer]  Preventive Care 54-51 Years Old, Female Preventive care refers to lifestyle choices and visits with your health care provider that can promote health and wellness. This includes:  A yearly physical exam. This is also called an annual wellness visit.  Regular dental and eye exams.  Immunizations.  Screening for certain conditions.  Healthy lifestyle choices, such as: ? Eating a healthy diet. ? Getting regular exercise. ? Not using drugs or products that contain nicotine and tobacco. ? Limiting alcohol use. What can I expect for my preventive care visit? Physical exam Your health care provider will check your:  Height and weight. These may  be used to calculate your BMI (body mass index). BMI is a measurement that tells if you are at a healthy weight.  Heart rate and blood pressure.  Body temperature.  Skin for abnormal spots. Counseling Your health care provider may ask you questions about your:  Past medical problems.  Family's medical history.  Alcohol, tobacco, and drug use.  Emotional well-being.  Home life and relationship well-being.  Sexual activity.  Diet, exercise, and sleep habits.  Work and work Statistician.  Access to firearms.  Method of birth control.  Menstrual cycle.  Pregnancy history. What immunizations do I need? Vaccines are usually given at various ages, according to a schedule. Your health care provider will recommend vaccines for you based on your age, medical history, and lifestyle or other factors,  such as travel or where you work.   What tests do I need? Blood tests  Lipid and cholesterol levels. These may be checked every 5 years, or more often if you are over 65 years old.  Hepatitis C test.  Hepatitis B test. Screening  Lung cancer screening. You may have this screening every year starting at age 90 if you have a 30-pack-year history of smoking and currently smoke or have quit within the past 15 years.  Colorectal cancer screening. ? All adults should have this screening starting at age 16 and continuing until age 38. ? Your health care provider may recommend screening at age 30 if you are at increased risk. ? You will have tests every 1-10 years, depending on your results and the type of screening test.  Diabetes screening. ? This is done by checking your blood sugar (glucose) after you have not eaten for a while (fasting). ? You may have this done every 1-3 years.  Mammogram. ? This may be done every 1-2 years. ? Talk with your health care provider about when you should start having regular mammograms. This may depend on whether you have a family history of breast cancer.  BRCA-related cancer screening. This may be done if you have a family history of breast, ovarian, tubal, or peritoneal cancers.  Pelvic exam and Pap test. ? This may be done every 3 years starting at age 58. ? Starting at age 60, this may be done every 5 years if you have a Pap test in combination with an HPV test. Other tests  STD (sexually transmitted disease) testing, if you are at risk.  Bone density scan. This is done to screen for osteoporosis. You may have this scan if you are at high risk for osteoporosis. Talk with your health care provider about your test results, treatment options, and if necessary, the need for more tests. Follow these instructions at home: Eating and drinking  Eat a diet that includes fresh fruits and vegetables, whole grains, lean protein, and low-fat dairy  products.  Take vitamin and mineral supplements as recommended by your health care provider.  Do not drink alcohol if: ? Your health care provider tells you not to drink. ? You are pregnant, may be pregnant, or are planning to become pregnant.  If you drink alcohol: ? Limit how much you have to 0-1 drink a day. ? Be aware of how much alcohol is in your drink. In the [place] one drink equals one 12 oz bottle of beer (355 mL), one 5 oz glass of wine (148 mL), or one 1 oz glass of hard liquor (44 mL).   Lifestyle  Take daily care of your teeth and  gums. Brush your teeth every morning and night with fluoride toothpaste. Floss one time each day.  Stay active. Exercise for at least 30 minutes 5 or more days each week.  Do not use any products that contain nicotine or tobacco, such as cigarettes, e-cigarettes, and chewing tobacco. If you need help quitting, ask your health care provider.  Do not use drugs.  If you are sexually active, practice safe sex. Use a condom or other form of protection to prevent STIs (sexually transmitted infections).  If you do not wish to become pregnant, use a form of birth control. If you plan to become pregnant, see your health care provider for a prepregnancy visit.  If told by your health care provider, take low-dose aspirin daily starting at age 61.  Find healthy ways to cope with stress, such as: ? Meditation, yoga, or listening to music. ? Journaling. ? Talking to a trusted person. ? Spending time with friends and family. Safety  Always wear your seat belt while driving or riding in a vehicle.  Do not drive: ? If you have been drinking alcohol. Do not ride with someone who has been drinking. ? When you are tired or distracted. ? While texting.  Wear a helmet and other protective equipment during sports activities.  If you have firearms in your house, make sure you follow all gun safety procedures. What's next?  Visit your health care provider  once a year for an annual wellness visit.  Ask your health care provider how often you should have your eyes and teeth checked.  Stay up to date on all vaccines. This information is not intended to replace advice given to you by your health care provider. Make sure you discuss any questions you have with your health care provider. Document Revised: 07/23/2020 Document Reviewed: 06/30/2018 Elsevier Patient Education  2021 [employer].

## 2021-03-30 ENCOUNTER — Other Ambulatory Visit: Payer: Self-pay | Admitting: Family Medicine

## 2021-03-30 DIAGNOSIS — E1169 Type 2 diabetes mellitus with other specified complication: Secondary | ICD-10-CM

## 2021-03-30 DIAGNOSIS — I10 Essential (primary) hypertension: Secondary | ICD-10-CM

## 2021-03-30 DIAGNOSIS — Z794 Long term (current) use of insulin: Secondary | ICD-10-CM

## 2021-04-04 ENCOUNTER — Other Ambulatory Visit: Payer: Self-pay

## 2021-04-04 ENCOUNTER — Other Ambulatory Visit (INDEPENDENT_AMBULATORY_CARE_PROVIDER_SITE_OTHER): Payer: No Typology Code available for payment source

## 2021-04-04 DIAGNOSIS — I10 Essential (primary) hypertension: Secondary | ICD-10-CM | POA: Diagnosis not present

## 2021-04-04 DIAGNOSIS — Z794 Long term (current) use of insulin: Secondary | ICD-10-CM | POA: Diagnosis not present

## 2021-04-04 DIAGNOSIS — Z0000 Encounter for general adult medical examination without abnormal findings: Secondary | ICD-10-CM

## 2021-04-04 DIAGNOSIS — E1169 Type 2 diabetes mellitus with other specified complication: Secondary | ICD-10-CM

## 2021-04-04 LAB — URINALYSIS, ROUTINE W REFLEX MICROSCOPIC
Bilirubin Urine: NEGATIVE
Hgb urine dipstick: NEGATIVE
Ketones, ur: NEGATIVE
Leukocytes,Ua: NEGATIVE
Nitrite: NEGATIVE
Specific Gravity, Urine: 1.02 (ref 1.000–1.030)
Total Protein, Urine: NEGATIVE
Urine Glucose: NEGATIVE
Urobilinogen, UA: 0.2 (ref 0.0–1.0)
pH: 5.5 (ref 5.0–8.0)

## 2021-04-04 LAB — CBC
HCT: 38.8 % (ref 36.0–46.0)
Hemoglobin: 12.5 g/dL (ref 12.0–15.0)
MCHC: 32.2 g/dL (ref 30.0–36.0)
MCV: 71.9 fl — ABNORMAL LOW (ref 78.0–100.0)
Platelets: 222 10*3/uL (ref 150.0–400.0)
RBC: 5.39 Mil/uL — ABNORMAL HIGH (ref 3.87–5.11)
RDW: 14 % (ref 11.5–15.5)
WBC: 9.2 10*3/uL (ref 4.0–10.5)

## 2021-04-04 LAB — HEMOGLOBIN A1C: Hgb A1c MFr Bld: 8.8 % — ABNORMAL HIGH (ref 4.6–6.5)

## 2021-04-04 NOTE — Progress Notes (Signed)
Per orders of Dr. Doreene Burke pt is here. For labs pt tolerated draw well.

## 2021-04-07 LAB — HEPATIC FUNCTION PANEL
ALT: 11 U/L (ref 0–35)
AST: 12 U/L (ref 0–37)
Albumin: 4 g/dL (ref 3.5–5.2)
Alkaline Phosphatase: 57 U/L (ref 39–117)
Bilirubin, Direct: 0.1 mg/dL (ref 0.0–0.3)
Total Bilirubin: 0.4 mg/dL (ref 0.2–1.2)
Total Protein: 6.6 g/dL (ref 6.0–8.3)

## 2021-04-07 LAB — LIPID PANEL
Cholesterol: 247 mg/dL — ABNORMAL HIGH (ref 0–200)
HDL: 42.4 mg/dL (ref >39.00)
LDL Cholesterol: 182 mg/dL — ABNORMAL HIGH (ref 0–99)
NonHDL: 204.61
Total CHOL/HDL Ratio: 6
Triglycerides: 114 mg/dL (ref 0.0–149.0)
VLDL: 22.8 mg/dL (ref 0.0–40.0)

## 2021-04-07 LAB — BASIC METABOLIC PANEL
BUN: 21 mg/dL (ref 6–23)
CO2: 27 mEq/L (ref 19–32)
Calcium: 9.3 mg/dL (ref 8.4–10.5)
Chloride: 102 mEq/L (ref 96–112)
Creatinine, Ser: 0.76 mg/dL (ref 0.40–1.20)
GFR: 83.37 mL/min (ref >60.00)
Glucose, Bld: 103 mg/dL — ABNORMAL HIGH (ref 70–99)
Potassium: 4 mEq/L (ref 3.5–5.1)
Sodium: 139 mEq/L (ref 135–145)

## 2021-04-07 LAB — MICROALBUMIN / CREATININE URINE RATIO
Creatinine,U: 118.8 mg/dL
Microalb Creat Ratio: 0.7 mg/g (ref 0.0–30.0)
Microalb, Ur: 0.8 mg/dL (ref 0.0–1.9)

## 2021-04-07 LAB — LDL CHOLESTEROL, DIRECT: Direct LDL: 172 mg/dL

## 2021-04-07 MED ORDER — SITAGLIPTIN PHOSPHATE 50 MG PO TABS: 50.0000 mg | ORAL_TABLET | Freq: Every day | ORAL | 1 refills | Status: DC

## 2021-04-07 MED ORDER — ROSUVASTATIN CALCIUM 20 MG PO TABS: 20.0000 mg | ORAL_TABLET | Freq: Every day | ORAL | 3 refills | Status: DC

## 2021-04-07 NOTE — Addendum Note (Signed)
Addended by: Andrez Grime on: 04/07/2021 03:44 PM   Modules accepted: Orders

## 2021-04-08 ENCOUNTER — Ambulatory Visit
Admission: RE | Admit: 2021-04-08 | Discharge: 2021-04-08 | Disposition: A | Discharge status: Home / Self Care | Payer: No Typology Code available for payment source | Source: Ambulatory Visit | Attending: Surgical Oncology | Admitting: Surgical Oncology

## 2021-04-08 ENCOUNTER — Other Ambulatory Visit: Payer: Self-pay | Admitting: Surgical Oncology

## 2021-04-08 DIAGNOSIS — K219 Gastro-esophageal reflux disease without esophagitis: Secondary | ICD-10-CM

## 2021-04-25 ENCOUNTER — Other Ambulatory Visit: Payer: Self-pay | Admitting: Family Medicine

## 2021-04-28 NOTE — Telephone Encounter (Signed)
Last fill 12/04/20  #1.63ml/5 Last OV 03/30/21

## 2021-05-16 ENCOUNTER — Other Ambulatory Visit: Payer: Self-pay | Admitting: Family Medicine

## 2021-05-16 ENCOUNTER — Other Ambulatory Visit: Payer: Self-pay | Admitting: Family

## 2021-05-16 DIAGNOSIS — E1169 Type 2 diabetes mellitus with other specified complication: Secondary | ICD-10-CM

## 2021-05-16 DIAGNOSIS — F418 Other specified anxiety disorders: Secondary | ICD-10-CM

## 2021-05-21 ENCOUNTER — Other Ambulatory Visit: Payer: Self-pay | Admitting: Family Medicine

## 2021-05-26 ENCOUNTER — Telehealth: Payer: Self-pay

## 2021-05-26 DIAGNOSIS — F418 Other specified anxiety disorders: Secondary | ICD-10-CM

## 2021-05-26 NOTE — Telephone Encounter (Signed)
Received a refill request for: Citalopram 20 mg LR 12/24/20, # 90, 1 refill LOV 03/27/21 FOV 09/21/21  Please review and advise.  Thanks. Dm/cma

## 2021-05-27 MED ORDER — CITALOPRAM HYDROBROMIDE 20 MG PO TABS: 20.0000 mg | ORAL_TABLET | Freq: Every day | ORAL | 0 refills | Status: DC

## 2021-05-27 NOTE — Addendum Note (Signed)
Addended by: Waymond Cera on: 05/27/2021 10:08 AM   Modules accepted: Orders

## 2021-05-27 NOTE — Telephone Encounter (Signed)
Patient notified VIA phone and appt scheduled for 810 @ 8:30 am. Dm/cma

## 2021-06-11 ENCOUNTER — Ambulatory Visit (INDEPENDENT_AMBULATORY_CARE_PROVIDER_SITE_OTHER): Payer: No Typology Code available for payment source | Admitting: Family Medicine

## 2021-06-11 ENCOUNTER — Other Ambulatory Visit: Payer: Self-pay

## 2021-06-11 ENCOUNTER — Encounter: Payer: Self-pay | Admitting: Family Medicine

## 2021-06-11 VITALS — BP 116/68 | HR 72 | Temp 97.1°F | Ht 62.0 in | Wt 246.0 lb

## 2021-06-11 DIAGNOSIS — E7800 Pure hypercholesterolemia, unspecified: Secondary | ICD-10-CM

## 2021-06-11 DIAGNOSIS — Z0000 Encounter for general adult medical examination without abnormal findings: Secondary | ICD-10-CM

## 2021-06-11 DIAGNOSIS — Z794 Long term (current) use of insulin: Secondary | ICD-10-CM

## 2021-06-11 DIAGNOSIS — I10 Essential (primary) hypertension: Secondary | ICD-10-CM

## 2021-06-11 DIAGNOSIS — E1169 Type 2 diabetes mellitus with other specified complication: Secondary | ICD-10-CM | POA: Diagnosis not present

## 2021-06-11 LAB — LIPID PANEL
Cholesterol: 178 mg/dL (ref 0–200)
HDL: 44.9 mg/dL (ref >39.00)
LDL Cholesterol: 120 mg/dL — ABNORMAL HIGH (ref 0–99)
NonHDL: 133.2
Total CHOL/HDL Ratio: 4
Triglycerides: 67 mg/dL (ref 0.0–149.0)
VLDL: 13.4 mg/dL (ref 0.0–40.0)

## 2021-06-11 LAB — COMPREHENSIVE METABOLIC PANEL
ALT: 11 U/L (ref 0–35)
AST: 13 U/L (ref 0–37)
Albumin: 4 g/dL (ref 3.5–5.2)
Alkaline Phosphatase: 55 U/L (ref 39–117)
BUN: 15 mg/dL (ref 6–23)
CO2: 28 mEq/L (ref 19–32)
Calcium: 9 mg/dL (ref 8.4–10.5)
Chloride: 104 mEq/L (ref 96–112)
Creatinine, Ser: 0.77 mg/dL (ref 0.40–1.20)
GFR: 81.97 mL/min (ref >60.00)
Glucose, Bld: 62 mg/dL — ABNORMAL LOW (ref 70–99)
Potassium: 3.8 mEq/L (ref 3.5–5.1)
Sodium: 141 mEq/L (ref 135–145)
Total Bilirubin: 0.4 mg/dL (ref 0.2–1.2)
Total Protein: 6.5 g/dL (ref 6.0–8.3)

## 2021-06-11 LAB — CBC
HCT: 38 % (ref 36.0–46.0)
Hemoglobin: 12 g/dL (ref 12.0–15.0)
MCHC: 31.6 g/dL (ref 30.0–36.0)
MCV: 72.3 fl — ABNORMAL LOW (ref 78.0–100.0)
Platelets: 212 10*3/uL (ref 150.0–400.0)
RBC: 5.25 Mil/uL — ABNORMAL HIGH (ref 3.87–5.11)
RDW: 14.1 % (ref 11.5–15.5)
WBC: 8.3 10*3/uL (ref 4.0–10.5)

## 2021-06-11 LAB — URINALYSIS, ROUTINE W REFLEX MICROSCOPIC
Bilirubin Urine: NEGATIVE
Hgb urine dipstick: NEGATIVE
Ketones, ur: NEGATIVE
Leukocytes,Ua: NEGATIVE
Nitrite: NEGATIVE
RBC / HPF: NONE SEEN (ref 0–?)
Specific Gravity, Urine: 1.025 (ref 1.000–1.030)
Total Protein, Urine: NEGATIVE
Urine Glucose: NEGATIVE
Urobilinogen, UA: 0.2 (ref 0.0–1.0)
pH: 5.5 (ref 5.0–8.0)

## 2021-06-11 LAB — HEMOGLOBIN A1C: Hgb A1c MFr Bld: 9 % — ABNORMAL HIGH (ref 4.6–6.5)

## 2021-06-11 MED ORDER — SIMVASTATIN 20 MG PO TABS: 20.0000 mg | ORAL_TABLET | Freq: Every day | ORAL | 3 refills | Status: DC

## 2021-06-11 NOTE — Progress Notes (Signed)
Established Patient Office Visit  Subjective:  Patient ID: Jillian Fowler, female    DOB: 1956-05-09  Age: 65 y.o. MRN: [id number]  CC:  Chief Complaint  Patient presents with   Follow-up    Follow up on cholesterol patient fasting for labs. Per patient she had to stop taking cholesterol meds due to leg cramps about 2 weeks ago.     HPI Jillian Fowler presents for follow-up of elevated ldl cholesterol, poorly controlled diabetes, hypertension, morbid obesity and health maintenance.  Has not had a Pap smear.  She has been cleared for bariatric surgery.  Blood pressure is well controlled on current regimen.  Had recommended Januvia after last visit but it was never started.  She assures me that she is reduce the carbohydrates in her diet.  Developed myalgias with rosuvastatin and discontinued it.  Past Medical History:  Diagnosis Date   Anxiety    Depression    Hyperlipidemia    Hypertension     Past Surgical History:  Procedure Laterality Date   UTERINE FIBROID SURGERY      Family History  Problem Relation Age of Onset   Hypertension Mother    Diabetes Mother    Arthritis Mother    Hypertension Father    Diabetes Father     Social History   Socioeconomic History   Marital status: Divorced    Spouse name: Not on file   Number of children: Not on file   Years of education: Not on file   Highest education level: Not on file  Occupational History   Not on file  Tobacco Use   Smoking status: Never   Smokeless tobacco: Never  Vaping Use   Vaping Use: Never used  Substance and Sexual Activity   Alcohol use: Never   Drug use: Never   Sexual activity: Not Currently  Other Topics Concern   Not on file  Social History Narrative   Not on file   Social Determinants of Health   Financial Resource Strain: Not on file  Food Insecurity: Not on file  Transportation Needs: Not on file  Physical Activity: Not on file  Stress: Not on file  Social Connections: Not on  file  Intimate Partner Violence: Not on file    Outpatient Medications Prior to Visit  Medication Sig Dispense Refill   citalopram (CELEXA) 20 MG tablet Take 1 tablet (20 mg total) by mouth daily. 90 tablet 0   lisinopril-hydrochlorothiazide (ZESTORETIC) 20-25 MG tablet Take 1 tablet by mouth once daily 90 tablet 1   metFORMIN (GLUCOPHAGE) 500 MG tablet Take 2 tablets by mouth once daily 180 tablet 1   OZEMPIC, 1 MG/DOSE, 4 MG/3ML SOPN INJECT 0.75 MLS (1 MG TOTAL) INTO THE SKIN ONCE A WEEK 9 mL 0   TOUJEO SOLOSTAR 300 UNIT/ML Solostar Pen INJECT 65 UNITS SUBCUTANEOUSLY AT BEDTIME 18 mL 4   rosuvastatin (CRESTOR) 20 MG tablet Take 1 tablet (20 mg total) by mouth daily. (Patient not taking: Reported on 06/11/2021) 90 tablet 3   zolpidem (AMBIEN CR) 12.5 MG CR tablet Take 1 tablet (12.5 mg total) by mouth at bedtime as needed for sleep. (Patient not taking: No sig reported) 30 tablet 0   No facility-administered medications prior to visit.    Not on File  ROS Review of Systems  Constitutional: Negative.   HENT: Negative.    Eyes:  Negative for photophobia and visual disturbance.  Respiratory: Negative.    Cardiovascular: Negative.   Gastrointestinal: Negative.  Genitourinary: Negative.   Neurological:  Negative for speech difficulty and weakness.     Objective:    Physical Exam Vitals and nursing note reviewed.  Constitutional:      General: She is not in acute distress.    Appearance: Normal appearance. She is obese. She is not ill-appearing, toxic-appearing or diaphoretic.  HENT:     Head: Normocephalic and atraumatic.     Right Ear: Tympanic membrane, ear canal and external ear normal.     Left Ear: Tympanic membrane, ear canal and external ear normal.     Mouth/Throat:     Mouth: Mucous membranes are moist.     Pharynx: Oropharynx is clear. No oropharyngeal exudate or posterior oropharyngeal erythema.  Eyes:     General: No scleral icterus.       Right eye: No  discharge.        Left eye: No discharge.     Extraocular Movements: Extraocular movements intact.     Conjunctiva/sclera: Conjunctivae normal.     Pupils: Pupils are equal, round, and reactive to light.  Neck:     Vascular: No carotid bruit.  Cardiovascular:     Rate and Rhythm: Normal rate and regular rhythm.  Pulmonary:     Effort: Pulmonary effort is normal.     Breath sounds: Normal breath sounds.  Abdominal:     General: Bowel sounds are normal.  Musculoskeletal:     Cervical back: Normal range of motion and neck supple. No rigidity or tenderness.  Lymphadenopathy:     Cervical: No cervical adenopathy.  Skin:    General: Skin is warm and dry.  Neurological:     Mental Status: She is alert and oriented to person, place, and time.    BP 116/68 (BP Location: Right Arm, Patient Position: Sitting, Cuff Size: Normal)   Pulse 72   Temp (!) 97.1 F (36.2 C) (Temporal)   Ht 5\' 2"  (1.575 m)   Wt 246 lb (111.6 kg)   SpO2 94%   BMI 44.99 kg/m  Wt Readings from Last 3 Encounters:  06/11/21 246 lb (111.6 kg)  03/27/21 252 lb 3.2 oz (114.4 kg)  04/19/20 252 lb 9.6 oz (114.6 kg)     Health Maintenance Due  Topic Date Due   PNEUMOCOCCAL POLYSACCHARIDE VACCINE AGE 34-64 HIGH RISK  Never done   TETANUS/TDAP  Never done   PAP SMEAR-Modifier  Never done   Zoster Vaccines- Shingrix (1 of 2) Never done   INFLUENZA VACCINE  06/02/2021    There are no preventive care reminders to display for this patient.  Lab Results  Component Value Date   TSH 1.58 04/19/2020   Lab Results  Component Value Date   WBC 9.2 04/04/2021   HGB 12.5 04/04/2021   HCT 38.8 04/04/2021   MCV 71.9 (L) 04/04/2021   PLT 222.0 04/04/2021   Lab Results  Component Value Date   NA 139 04/04/2021   K 4.0 04/04/2021   CO2 27 04/04/2021   GLUCOSE 103 (H) 04/04/2021   BUN 21 04/04/2021   CREATININE 0.76 04/04/2021   BILITOT 0.4 04/04/2021   ALKPHOS 57 04/04/2021   AST 12 04/04/2021   ALT 11  04/04/2021   PROT 6.6 04/04/2021   ALBUMIN 4.0 04/04/2021   CALCIUM 9.3 04/04/2021   GFR 83.37 04/04/2021   Lab Results  Component Value Date   CHOL 247 (H) 04/04/2021   Lab Results  Component Value Date   HDL 42.40 04/04/2021   Lab  Results  Component Value Date   LDLCALC 182 (H) 04/04/2021   Lab Results  Component Value Date   TRIG 114.0 04/04/2021   Lab Results  Component Value Date   CHOLHDL 6 04/04/2021   Lab Results  Component Value Date   HGBA1C 8.8 (H) 04/04/2021      Assessment & Plan:   Problem List Items Addressed This Visit       Cardiovascular and Mediastinum   Essential hypertension   Relevant Medications   simvastatin (ZOCOR) 20 MG tablet   Other Relevant Orders   CBC   Urinalysis, Routine w reflex microscopic     Endocrine   Diabetes mellitus (HCC) - Primary   Relevant Medications   simvastatin (ZOCOR) 20 MG tablet   Other Relevant Orders   Comprehensive metabolic panel   Hemoglobin A1c   Urinalysis, Routine w reflex microscopic     Other   Morbid obesity (HCC)   Elevated LDL cholesterol level   Relevant Medications   simvastatin (ZOCOR) 20 MG tablet   Other Relevant Orders   Lipid panel   Healthcare maintenance   Relevant Orders   Ambulatory referral to Gynecology    Meds ordered this encounter  Medications   simvastatin (ZOCOR) 20 MG tablet    Sig: Take 1 tablet (20 mg total) by mouth at bedtime.    Dispense:  90 tablet    Refill:  3    Follow-up: Return in about 3 months (around 09/11/2021).  Will try simvastatin p.m.  Discussed increased risk of arterial blockages with diabetes.  Wished her well with her upcoming bariatric surgery.  Mliss Sax, MD

## 2021-06-12 MED ORDER — SEMAGLUTIDE (1 MG/DOSE) 4 MG/3ML ~~LOC~~ SOPN: 1.0000 mg | PEN_INJECTOR | SUBCUTANEOUS | 1 refills | Status: AC

## 2021-06-12 NOTE — Addendum Note (Signed)
Addended by: Andrez Grime on: 06/12/2021 08:09 AM   Modules accepted: Orders

## 2021-08-12 ENCOUNTER — Other Ambulatory Visit: Payer: Self-pay

## 2021-08-13 ENCOUNTER — Ambulatory Visit (INDEPENDENT_AMBULATORY_CARE_PROVIDER_SITE_OTHER): Payer: No Typology Code available for payment source | Admitting: Family Medicine

## 2021-08-13 ENCOUNTER — Encounter: Payer: Self-pay | Admitting: Family Medicine

## 2021-08-13 VITALS — BP 114/68 | HR 68 | Temp 97.1°F | Ht 62.0 in | Wt 242.2 lb

## 2021-08-13 DIAGNOSIS — G5603 Carpal tunnel syndrome, bilateral upper limbs: Secondary | ICD-10-CM

## 2021-08-13 DIAGNOSIS — E1169 Type 2 diabetes mellitus with other specified complication: Secondary | ICD-10-CM

## 2021-08-13 DIAGNOSIS — Z794 Long term (current) use of insulin: Secondary | ICD-10-CM | POA: Diagnosis not present

## 2021-08-13 NOTE — Progress Notes (Signed)
[facility] [address] [place] [address] Dept: [phone] Dept Fax: [phone]  Office Visit  Subjective:    Patient ID: Jillian Fowler, female    DOB: 01-27-1956, 65 y.o..   MRN: [id number]  Chief Complaint  Patient presents with   Acute Visit    C/o having numbness in both hands in the am. Also wants to discuss medication prior to having biatric surgery on 09/16/21.     History of Present Illness:  Patient is in today for assessment of intermittent bilateral hand tingling/numbness. She notes this has been going on for more than a year. She notes she awakens int he morning with tingling in her hands and a numb feeling that can involve the whole arm. It is intermittent and resolves after she is up and around. Jillian Fowler denies any particular elbow or neck pain. She does a fair amount of typing each day.  Additionally, Jillian Fowler is scheduled for a biliopancreatic diversion with duodenal switch on Nov. 15.She was told she should speak with her PCP regarding her diaebtes medications, as she will be on a clear liquid diet for 2 weeks prior to her surgery. Jillian Fowler is currently taking:  metformin 500 mg, 2 tabs once a day sitagliptin 50 mg, 1 tab daily semaglutide, 1 mg inj once a week insulin glargine, 65 units qhs  She does have a home glucose monitor, but does not use this very often.  Past Medical History: Patient Active Problem List   Diagnosis Date Noted   Elevated LDL cholesterol level 06/11/2021   Healthcare maintenance 06/11/2021   Essential hypertension 04/19/2020   Diabetes mellitus (HCC) 04/19/2020   Morbid obesity (HCC) 04/19/2020   Heart murmur 04/19/2020   Thyroid nodule 04/19/2020   Depression with anxiety 04/19/2020   Primary insomnia 04/19/2020   Past Surgical History:  Procedure Laterality Date   UTERINE FIBROID SURGERY     Family History  Problem Relation Age of Onset    Hypertension Mother    Diabetes Mother    Arthritis Mother    Hypertension Father    Diabetes Father    Outpatient Medications Prior to Visit  Medication Sig Dispense Refill   citalopram (CELEXA) 20 MG tablet Take 1 tablet (20 mg total) by mouth daily. 90 tablet 0   JANUVIA 50 MG tablet Take 50 mg by mouth daily.     lisinopril-hydrochlorothiazide (ZESTORETIC) 20-25 MG tablet Take 1 tablet by mouth once daily 90 tablet 1   metFORMIN (GLUCOPHAGE) 500 MG tablet Take 2 tablets by mouth once daily 180 tablet 1   Semaglutide, 1 MG/DOSE, 4 MG/3ML SOPN Inject 1 mg as directed once a week. 9 mL 1   simvastatin (ZOCOR) 20 MG tablet Take 1 tablet (20 mg total) by mouth at bedtime. 90 tablet 3   TOUJEO SOLOSTAR 300 UNIT/ML Solostar Pen INJECT 65 UNITS SUBCUTANEOUSLY AT BEDTIME 18 mL 4   No facility-administered medications prior to visit.   No Known Allergies    Objective:   Today's Vitals   08/13/21 0939  BP: 114/68  Pulse: 68  Temp: (!) 97.1 F (36.2 C)  TempSrc: Temporal  SpO2: 97%  Weight: 242 lb 3.2 oz (109.9 kg)  Height: 5\' 2"  (1.575 m)   Body mass index is 44.3 kg/m.   General: Well developed, well nourished. No acute distress. Extremities: Full ROM of upper extremities. No joint swelling or tenderness. No edema noted.   Tinel's -, Phalen's +. Psych: Alert  and oriented. Normal mood and affect.  Health Maintenance Due  Topic Date Due   PAP SMEAR-Modifier  Never done     Assessment & Plan:   1. Bilateral carpal tunnel syndrome The history and exam are suggestive of intermittent CTS. I recommend Jillian Fowler obtain wrist splints to use primarily at night while sleeping. We did discuss her evaluating what repetitive activities might continue to this and make adjustments in her work to decrease wrist stress.  2. Morbid obesity (HCC) 3. Type 2 diabetes mellitus with other specified complication, with long-term current use of insulin (HCC) I recommend Jillian Fowler hold her  Ozempic once starting a clear liquid diet 2 weeks ahead of metabolic surgery. She should also decrease Tuojeo dose to 32 units qhs once starting liquid diet. I recommend she monitor her fasting blood sugars, with a goal of 70-120. If BS outside of goal while on liquid diet, she should contact PCP to discuss med management. We also reviewed hypoglycemia management. While on the clear liquid diet, I recommend she keep a sports drink available to respond to low sugars.  Loyola Mast, MD

## 2021-08-13 NOTE — Patient Instructions (Signed)
Hold Ozempic once starting a clear liquid diet 2 weeks ahead of metabolic surgery. Decrease Tuojeo dose to 32 units qhs once starting liquid diet. Monitor fasting blood sugars. Goal is 70-120. If BS outside of goal while on liquid diet, contact PCP to discuss med management.  Carpal Tunnel Syndrome Carpal tunnel syndrome is a condition that causes pain, numbness, and weakness in your hand and fingers. The carpal tunnel is a narrow area located on the palm side of your wrist. Repeated wrist motion or certain diseases may cause swelling within the tunnel. This swelling pinches the main nerve in the wrist. The main nerve in the wrist is called the median nerve. What are the causes? This condition may be caused by: Repeated and forceful wrist and hand motions. Wrist injuries. Arthritis. A cyst or tumor in the carpal tunnel. Fluid buildup during pregnancy. Use of tools that vibrate. Sometimes the cause of this condition is not known. What increases the risk? The following factors may make you more likely to develop this condition: Having a job that requires you to repeatedly or forcefully move your wrist or hand or requires you to use tools that vibrate. This may include jobs that involve using computers, working on an [employer], or working with power tools such as Radiographer, therapeutic. Being a woman. Having certain conditions, such as: Diabetes. Obesity. An underactive thyroid (hypothyroidism). Kidney failure. Rheumatoid arthritis. What are the signs or symptoms? Symptoms of this condition include: A tingling feeling in your fingers, especially in your thumb, index, and middle fingers. Tingling or numbness in your hand. An aching feeling in your entire arm, especially when your wrist and elbow are bent for a long time. Wrist pain that goes up your arm to your shoulder. Pain that goes down into your palm or fingers. A weak feeling in your hands. You may have trouble grabbing and holding  items. Your symptoms may feel worse during the night. How is this diagnosed? This condition is diagnosed with a medical history and physical exam. You may also have tests, including: Electromyogram (EMG). This test measures electrical signals sent by your nerves into the muscles. Nerve conduction study. This test measures how well electrical signals pass through your nerves. Imaging tests, such as X-rays, ultrasound, and MRI. These tests check for possible causes of your condition. How is this treated? This condition may be treated with: Lifestyle changes. It is important to stop or change the activity that caused your condition. Doing exercise and activities to strengthen and stretch your muscles and tendons (physical therapy). Making lifestyle changes to help with your condition and learning how to do your daily activities safely (occupational therapy). Medicines for pain and inflammation. This may include medicine that is injected into your wrist. A wrist splint or brace. Surgery. Follow these instructions at home: If you have a splint or brace: Wear the splint or brace as told by your health care provider. Remove it only as told by your health care provider. Loosen the splint or brace if your fingers tingle, become numb, or turn cold and blue. Keep the splint or brace clean. If the splint or brace is not waterproof: Do not let it get wet. Cover it with a watertight covering when you take a bath or shower. Managing pain, stiffness, and swelling If directed, put ice on the painful area. To do this: If you have a removeable splint or brace, remove it as told by your health care provider. Put ice in a plastic bag.  Place a towel between your skin and the bag or between the splint or brace and the bag. Leave the ice on for 20 minutes, 2-3 times a day. Do not fall asleep with the cold pack on your skin. Remove the ice if your skin turns bright red. This is very important. If you cannot feel  pain, heat, or cold, you have a greater risk of damage to the area. Move your fingers often to reduce stiffness and swelling. General instructions Take over-the-counter and prescription medicines only as told by your health care provider. Rest your wrist and hand from any activity that may be causing your pain. If your condition is work related, talk with your employer about changes that can be made, such as getting a wrist pad to use while typing. Do any exercises as told by your health care provider, physical therapist, or occupational therapist. Keep all follow-up visits. This is important. Contact a health care provider if: You have new symptoms. Your pain is not controlled with medicines. Your symptoms get worse. Get help right away if: You have severe numbness or tingling in your wrist or hand. Summary Carpal tunnel syndrome is a condition that causes pain, numbness, and weakness in your hand and fingers. It is usually caused by repeated wrist motions. Lifestyle changes and medicines are used to treat carpal tunnel syndrome. Surgery may be recommended. Follow your health care provider's instructions about wearing a splint, resting from activity, keeping follow-up visits, and calling for help. This information is not intended to replace advice given to you by your health care provider. Make sure you discuss any questions you have with your health care provider. Document Revised: 02/29/2020 Document Reviewed: 02/29/2020 Elsevier Patient Education  2022 [employer].

## 2021-09-02 ENCOUNTER — Ambulatory Visit: Payer: No Typology Code available for payment source | Admitting: Obstetrics

## 2021-09-04 DIAGNOSIS — E785 Hyperlipidemia, unspecified: Secondary | ICD-10-CM | POA: Insufficient documentation

## 2021-09-30 ENCOUNTER — Other Ambulatory Visit: Payer: Self-pay | Admitting: Family Medicine

## 2021-09-30 DIAGNOSIS — F418 Other specified anxiety disorders: Secondary | ICD-10-CM

## 2021-10-01 ENCOUNTER — Ambulatory Visit: Payer: No Typology Code available for payment source | Admitting: Family Medicine

## 2021-10-03 ENCOUNTER — Telehealth: Payer: Self-pay | Admitting: Family Medicine

## 2021-10-03 NOTE — Telephone Encounter (Signed)
Patient would like to transfer care from Dr Doreene Burke to dr Veto Kemps who she saw last time, is this okay?

## 2021-10-07 ENCOUNTER — Ambulatory Visit: Payer: No Typology Code available for payment source | Admitting: Family Medicine

## 2021-10-07 ENCOUNTER — Ambulatory Visit: Payer: No Typology Code available for payment source | Admitting: Obstetrics & Gynecology

## 2021-10-13 NOTE — Telephone Encounter (Signed)
Tried to call to schedule TOC, unable to leave message

## 2021-10-23 ENCOUNTER — Telehealth: Payer: Self-pay | Admitting: Family Medicine

## 2021-10-23 ENCOUNTER — Other Ambulatory Visit: Payer: Self-pay | Admitting: Family Medicine

## 2021-10-23 DIAGNOSIS — E1169 Type 2 diabetes mellitus with other specified complication: Secondary | ICD-10-CM

## 2021-10-23 DIAGNOSIS — I10 Essential (primary) hypertension: Secondary | ICD-10-CM

## 2021-10-23 NOTE — Telephone Encounter (Signed)
Chart supports rx refill Last ov: 06/11/2021 Last refill: 06/08/2021

## 2021-10-24 NOTE — Telephone Encounter (Signed)
Refill sent.

## 2021-11-14 ENCOUNTER — Telehealth: Payer: Self-pay | Admitting: Family Medicine

## 2021-11-14 NOTE — Telephone Encounter (Signed)
Pt called saying her insurance will not cover Januvia. She gave three options of what it covers. Barbette Or, Tradjenta. Please advise.

## 2021-11-14 NOTE — Telephone Encounter (Signed)
Please advise message below patient calling states that insurance will not cover [place].

## 2021-11-17 NOTE — Telephone Encounter (Signed)
Returned patients call to inform that Dr. Ethelene Hal would like to schedule and appointment for follow up appointment before he's able to decide on another medication. No answer unable to leave a message.

## 2021-11-17 NOTE — Telephone Encounter (Signed)
Checking on status of this message.

## 2021-11-19 ENCOUNTER — Other Ambulatory Visit: Payer: Self-pay

## 2021-11-20 ENCOUNTER — Ambulatory Visit: Payer: No Typology Code available for payment source | Admitting: Family Medicine

## 2021-11-20 ENCOUNTER — Encounter: Payer: Self-pay | Admitting: Family Medicine

## 2021-11-20 VITALS — BP 122/70 | HR 72 | Temp 97.3°F | Ht 62.0 in | Wt 242.4 lb

## 2021-11-20 DIAGNOSIS — E1169 Type 2 diabetes mellitus with other specified complication: Secondary | ICD-10-CM

## 2021-11-20 DIAGNOSIS — Z794 Long term (current) use of insulin: Secondary | ICD-10-CM

## 2021-11-20 NOTE — Progress Notes (Signed)
Established Patient Office Visit  Subjective:  Patient ID: Jillian Fowler, female    DOB: 09-13-1956  Age: 66 y.o. MRN: [id number]  CC:  Chief Complaint  Patient presents with   Follow-up    3 month follow up would like to discuss medications if she should change anything after bariatric surgery.     HPI Jillian Fowler presents for a discussion about treating her type 2 diabetes after her upcoming bariatric surgery.  A duodenal switch is planned for the 23rd.  She is excited and apprehensive as would be expected.  Recent hemoglobin A1c was 6.9.  Fasting sugars typically run between 101 30 with 65 units glargine.  Continues with metformin.  Januvia was not covered by her insurance.  Blood pressure controlled with Zestoretic.  Continues with Celexa with good effect.  Past Medical History:  Diagnosis Date   Anxiety    Depression    Hyperlipidemia    Hypertension     Past Surgical History:  Procedure Laterality Date   EYE SURGERY  12/2020   UTERINE FIBROID SURGERY      Family History  Problem Relation Age of Onset   Hypertension Mother    Diabetes Mother    Arthritis Mother    Hypertension Father    Diabetes Father     Social History   Socioeconomic History   Marital status: Divorced    Spouse name: Not on file   Number of children: Not on file   Years of education: Not on file   Highest education level: Not on file  Occupational History   Not on file  Tobacco Use   Smoking status: Never   Smokeless tobacco: Never  Vaping Use   Vaping Use: Never used  Substance and Sexual Activity   Alcohol use: Never   Drug use: Never   Sexual activity: Not Currently  Other Topics Concern   Not on file  Social History Narrative   Not on file   Social Determinants of Health   Financial Resource Strain: Not on file  Food Insecurity: Not on file  Transportation Needs: Not on file  Physical Activity: Not on file  Stress: Not on file  Social Connections: Not on file   Intimate Partner Violence: Not on file    Outpatient Medications Prior to Visit  Medication Sig Dispense Refill   citalopram (CELEXA) 20 MG tablet Take 1 tablet by mouth once daily 90 tablet 0   JANUVIA 50 MG tablet Take 1 tablet by mouth once daily 90 tablet 0   lisinopril-hydrochlorothiazide (ZESTORETIC) 20-25 MG tablet Take 1 tablet by mouth once daily 90 tablet 0   metFORMIN (GLUCOPHAGE) 500 MG tablet Take 2 tablets by mouth once daily 180 tablet 1   simvastatin (ZOCOR) 20 MG tablet Take 1 tablet (20 mg total) by mouth at bedtime. 90 tablet 3   TOUJEO SOLOSTAR 300 UNIT/ML Solostar Pen INJECT 65 UNITS SUBCUTANEOUSLY AT BEDTIME 18 mL 4   No facility-administered medications prior to visit.    No Known Allergies  ROS Review of Systems  Constitutional:  Negative for diaphoresis, fatigue, fever and unexpected weight change.  Respiratory: Negative.    Cardiovascular: Negative.  Negative for palpitations.  Gastrointestinal: Negative.   Endocrine: Negative for polyphagia and polyuria.  Genitourinary:  Negative for frequency.  Neurological:  Negative for light-headedness.     Objective:    Physical Exam Vitals and nursing note reviewed.  Constitutional:      General: She is not in acute distress.  Appearance: Normal appearance. She is obese. She is not ill-appearing, toxic-appearing or diaphoretic.  HENT:     Head: Normocephalic and atraumatic.     Right Ear: External ear normal.     Left Ear: External ear normal.  Pulmonary:     Effort: Pulmonary effort is normal.  Skin:    General: Skin is warm and dry.  Neurological:     Mental Status: She is alert and oriented to person, place, and time.  Psychiatric:        Mood and Affect: Mood normal.        Behavior: Behavior normal.    BP 122/70 (BP Location: Right Arm, Patient Position: Sitting, Cuff Size: Large)    Pulse 72    Temp (!) 97.3 F (36.3 C) (Temporal)    Ht 5\' 2"  (1.575 m)    Wt 242 lb 6.4 oz (110 kg)    SpO2  93%    BMI 44.34 kg/m  Wt Readings from Last 3 Encounters:  11/20/21 242 lb 6.4 oz (110 kg)  08/13/21 242 lb 3.2 oz (109.9 kg)  06/11/21 246 lb (111.6 kg)     Health Maintenance Due  Topic Date Due   PAP SMEAR-Modifier  Never done   Zoster Vaccines- Shingrix (2 of 2) 08/19/2021    There are no preventive care reminders to display for this patient.  Lab Results  Component Value Date   TSH 1.58 04/19/2020   Lab Results  Component Value Date   WBC 8.3 06/11/2021   HGB 12.0 06/11/2021   HCT 38.0 06/11/2021   MCV 72.3 (L) 06/11/2021   PLT 212.0 06/11/2021   Lab Results  Component Value Date   NA 141 06/11/2021   K 3.8 06/11/2021   CO2 28 06/11/2021   GLUCOSE 62 (L) 06/11/2021   BUN 15 06/11/2021   CREATININE 0.77 06/11/2021   BILITOT 0.4 06/11/2021   ALKPHOS 55 06/11/2021   AST 13 06/11/2021   ALT 11 06/11/2021   PROT 6.5 06/11/2021   ALBUMIN 4.0 06/11/2021   CALCIUM 9.0 06/11/2021   GFR 81.97 06/11/2021   Lab Results  Component Value Date   CHOL 178 06/11/2021   Lab Results  Component Value Date   HDL 44.90 06/11/2021   Lab Results  Component Value Date   LDLCALC 120 (H) 06/11/2021   Lab Results  Component Value Date   TRIG 67.0 06/11/2021   Lab Results  Component Value Date   CHOLHDL 4 06/11/2021   Lab Results  Component Value Date   HGBA1C 9.0 (H) 06/11/2021      Assessment & Plan:   Problem List Items Addressed This Visit       Endocrine   Diabetes mellitus ([place]) - Primary    No orders of the defined types were placed in this encounter.   Follow-up: Return in about 2 months (around 01/18/2022).  Wished her well with her surgery.  She is aware that with time and weight loss medicine dosages will be decreased.  We discussed decreasing the glargine by 4 to 5 units with fasting sugars running in the less than 100 range.  We will continue metformin and Zestoretic.  She will also be able to decrease the dose of Zestoretic.  Follow-up in 2  months.  Please call me if there are any concerns or problems.  Libby Maw, MD

## 2021-12-27 ENCOUNTER — Other Ambulatory Visit: Payer: Self-pay | Admitting: Family Medicine

## 2021-12-27 DIAGNOSIS — F418 Other specified anxiety disorders: Secondary | ICD-10-CM

## 2021-12-27 DIAGNOSIS — E1169 Type 2 diabetes mellitus with other specified complication: Secondary | ICD-10-CM

## 2021-12-27 DIAGNOSIS — Z794 Long term (current) use of insulin: Secondary | ICD-10-CM

## 2022-01-06 ENCOUNTER — Encounter: Payer: Self-pay | Admitting: Family Medicine

## 2022-01-10 ENCOUNTER — Other Ambulatory Visit: Payer: Self-pay | Admitting: Family Medicine

## 2022-01-10 DIAGNOSIS — Z794 Long term (current) use of insulin: Secondary | ICD-10-CM

## 2022-01-10 DIAGNOSIS — E1169 Type 2 diabetes mellitus with other specified complication: Secondary | ICD-10-CM

## 2022-01-10 DIAGNOSIS — I10 Essential (primary) hypertension: Secondary | ICD-10-CM

## 2022-01-14 ENCOUNTER — Other Ambulatory Visit: Payer: Self-pay | Admitting: Family Medicine

## 2022-01-14 DIAGNOSIS — E1169 Type 2 diabetes mellitus with other specified complication: Secondary | ICD-10-CM

## 2022-01-21 ENCOUNTER — Ambulatory Visit: Payer: No Typology Code available for payment source | Admitting: Family Medicine

## 2022-01-21 ENCOUNTER — Telehealth: Payer: Self-pay | Admitting: Family Medicine

## 2022-01-21 NOTE — Telephone Encounter (Signed)
Pt did not show up for her 9 app on 01/21/22 to see Dr. Doreene Burke. I sent her a no show letter.  ?

## 2022-01-26 ENCOUNTER — Ambulatory Visit (INDEPENDENT_AMBULATORY_CARE_PROVIDER_SITE_OTHER): Payer: No Typology Code available for payment source | Admitting: Family Medicine

## 2022-01-26 ENCOUNTER — Encounter: Payer: Self-pay | Admitting: Family Medicine

## 2022-01-26 DIAGNOSIS — I10 Essential (primary) hypertension: Secondary | ICD-10-CM

## 2022-01-26 DIAGNOSIS — E1169 Type 2 diabetes mellitus with other specified complication: Secondary | ICD-10-CM | POA: Diagnosis not present

## 2022-01-26 DIAGNOSIS — Z794 Long term (current) use of insulin: Secondary | ICD-10-CM

## 2022-01-26 MED ORDER — TIRZEPATIDE 5 MG/0.5ML ~~LOC~~ SOAJ: 5.0000 mg | SUBCUTANEOUS | 2 refills | Status: DC

## 2022-01-26 NOTE — Progress Notes (Signed)
? ?Established Patient Office Visit ? ?Subjective:  ?Patient ID: Jillian Fowler, female    DOB: 03/27/1956  Age: 66 y.o. MRN: [id number] ? ?CC:  ?Chief Complaint  ?Patient presents with  ? Follow-up  ?  2 month follow up, patient states that she was not able to have weight loss surgery would like Rx for Mounjaro to help with weight loss.   ? ? ?HPI ?Clessie Karras presents for follow-up of obesity diabetes and hypertension.  Unable to move forward because of scarring. She would like to try mounjaro. ?BP controled with zestoretic. ? ?Past Medical History:  ?Diagnosis Date  ? Anxiety   ? Depression   ? Hyperlipidemia   ? Hypertension   ? ? ?Past Surgical History:  ?Procedure Laterality Date  ? EYE SURGERY  12/2020  ? UTERINE FIBROID SURGERY    ? ? ?Family History  ?Problem Relation Age of Onset  ? Hypertension Mother   ? Diabetes Mother   ? Arthritis Mother   ? Hypertension Father   ? Diabetes Father   ? ? ?Social History  ? ?Socioeconomic History  ? Marital status: Divorced  ?  Spouse name: Not on file  ? Number of children: Not on file  ? Years of education: Not on file  ? Highest education level: Not on file  ?Occupational History  ? Not on file  ?Tobacco Use  ? Smoking status: Never  ? Smokeless tobacco: Never  ?Vaping Use  ? Vaping Use: Never used  ?Substance and Sexual Activity  ? Alcohol use: Never  ? Drug use: Never  ? Sexual activity: Not Currently  ?Other Topics Concern  ? Not on file  ?Social History Narrative  ? Not on file  ? ?Social Determinants of Health  ? ?Financial Resource Strain: Not on file  ?Food Insecurity: Not on file  ?Transportation Needs: Not on file  ?Physical Activity: Not on file  ?Stress: Not on file  ?Social Connections: Not on file  ?Intimate Partner Violence: Not on file  ? ? ?Outpatient Medications Prior to Visit  ?Medication Sig Dispense Refill  ? acetaminophen (TYLENOL) 325 MG tablet Take by mouth.    ? citalopram (CELEXA) 20 MG tablet Take 1 tablet by mouth once daily 5 tablet 0   ? lisinopril-hydrochlorothiazide (ZESTORETIC) 20-25 MG tablet Take 1 tablet by mouth once daily 90 tablet 0  ? metFORMIN (GLUCOPHAGE) 500 MG tablet Take 2 tablets by mouth once daily 56 tablet 0  ? simvastatin (ZOCOR) 20 MG tablet Take 1 tablet (20 mg total) by mouth at bedtime. 90 tablet 3  ? TOUJEO SOLOSTAR 300 UNIT/ML Solostar Pen INJECT 65 UNITS SUBCUTANEOUSLY AT BEDTIME 18 mL 4  ? omeprazole (PRILOSEC) 20 MG capsule Take 1 capsule by mouth daily. (Patient not taking: Reported on 01/26/2022)    ? promethazine (PHENERGAN) 25 MG tablet Take by mouth.    ? ursodiol (ACTIGALL) 250 MG tablet Take by mouth.    ? ?No facility-administered medications prior to visit.  ? ? ?No Known Allergies ? ?ROS ?Review of Systems  ?Constitutional:  Negative for chills, fatigue, fever and unexpected weight change.  ?HENT: Negative.    ?Eyes:  Negative for photophobia and visual disturbance.  ?Respiratory: Negative.    ?Cardiovascular: Negative.   ?Gastrointestinal: Negative.   ?Endocrine: Negative for polyphagia and polyuria.  ?Genitourinary: Negative.   ?Musculoskeletal:  Negative for gait problem and joint swelling.  ?Neurological:  Negative for speech difficulty and weakness.  ? ?  ?Objective:  ?  ?  Physical Exam ?Vitals and nursing note reviewed.  ?Constitutional:   ?   Appearance: Normal appearance.  ?HENT:  ?   Head: Normocephalic and atraumatic.  ?   Right Ear: Tympanic membrane, ear canal and external ear normal.  ?   Left Ear: Tympanic membrane, ear canal and external ear normal.  ?   Mouth/Throat:  ?   Mouth: Mucous membranes are moist.  ?   Pharynx: Oropharynx is clear. No oropharyngeal exudate or posterior oropharyngeal erythema.  ?Eyes:  ?   General: No scleral icterus.    ?   Right eye: No discharge.     ?   Left eye: No discharge.  ?   Extraocular Movements: Extraocular movements intact.  ?   Conjunctiva/sclera: Conjunctivae normal.  ?   Pupils: Pupils are equal, round, and reactive to light.  ?Cardiovascular:  ?   Rate  and Rhythm: Normal rate and regular rhythm.  ?Pulmonary:  ?   Effort: Pulmonary effort is normal.  ?   Breath sounds: Normal breath sounds.  ?Musculoskeletal:  ?   Cervical back: No rigidity or tenderness.  ?Lymphadenopathy:  ?   Cervical: No cervical adenopathy.  ?Skin: ?   General: Skin is warm and dry.  ?Neurological:  ?   General: No focal deficit present.  ?   Mental Status: She is alert and oriented to person, place, and time.  ? ?Diabetic Foot Exam - Simple   ?Simple Foot Form ?Diabetic Foot exam was performed with the following findings: Yes 01/26/2022 10:26 AM  ?Visual Inspection ?No deformities, no ulcerations, no other skin breakdown bilaterally: Yes ?Sensation Testing ?Intact to touch and monofilament testing bilaterally: Yes ?Pulse Check ?Posterior Tibialis and Dorsalis pulse intact bilaterally: Yes ?Comments ?  ?  ? ?BP 132/70 (BP Location: Right Arm, Patient Position: Sitting, Cuff Size: Large)   Pulse 70   Temp (!) 97.1 ?F (36.2 ?C) (Temporal)   Ht 5\' 2"  (1.575 m)   Wt 243 lb 6.4 oz (110.4 kg)   SpO2 95%   BMI 44.52 kg/m?  ?Wt Readings from Last 3 Encounters:  ?01/26/22 243 lb 6.4 oz (110.4 kg)  ?11/20/21 242 lb 6.4 oz (110 kg)  ?08/13/21 242 lb 3.2 oz (109.9 kg)  ? ? ? ?Health Maintenance Due  ?Topic Date Due  ? HEMOGLOBIN A1C  12/12/2021  ? OPHTHALMOLOGY EXAM  12/31/2021  ? ? ?There are no preventive care reminders to display for this patient. ? ?Lab Results  ?Component Value Date  ? TSH 1.58 04/19/2020  ? ?Lab Results  ?Component Value Date  ? WBC 8.3 06/11/2021  ? HGB 12.0 06/11/2021  ? HCT 38.0 06/11/2021  ? MCV 72.3 (L) 06/11/2021  ? PLT 212.0 06/11/2021  ? ?Lab Results  ?Component Value Date  ? NA 141 06/11/2021  ? K 3.8 06/11/2021  ? CO2 28 06/11/2021  ? GLUCOSE 62 (L) 06/11/2021  ? BUN 15 06/11/2021  ? CREATININE 0.77 06/11/2021  ? BILITOT 0.4 06/11/2021  ? ALKPHOS 55 06/11/2021  ? AST 13 06/11/2021  ? ALT 11 06/11/2021  ? PROT 6.5 06/11/2021  ? ALBUMIN 4.0 06/11/2021  ? CALCIUM 9.0  06/11/2021  ? GFR 81.97 06/11/2021  ? ?Lab Results  ?Component Value Date  ? CHOL 178 06/11/2021  ? ?Lab Results  ?Component Value Date  ? HDL 44.90 06/11/2021  ? ?Lab Results  ?Component Value Date  ? LDLCALC 120 (H) 06/11/2021  ? ?Lab Results  ?Component Value Date  ? TRIG 67.0 06/11/2021  ? ?  Lab Results  ?Component Value Date  ? CHOLHDL 4 06/11/2021  ? ?Lab Results  ?Component Value Date  ? HGBA1C 9.0 (H) 06/11/2021  ? ? ?  ?Assessment & Plan:  ? ?Problem List Items Addressed This Visit   ? ?  ? Endocrine  ? Diabetes mellitus (HCC)  ? Relevant Medications  ? tirzepatide [facility]) 5 MG/0.5ML Pen  ?  ? Other  ? Morbid obesity (HCC) - Primary  ? Relevant Medications  ? tirzepatide [facility]) 5 MG/0.5ML Pen  ? ? ?Meds ordered this encounter  ?Medications  ? tirzepatide [facility]) 5 MG/0.5ML Pen  ?  Sig: Inject 5 mg into the skin once a week.  ?  Dispense:  4 mL  ?  Refill:  2  ? ? ?Follow-up: Return in about 3 months (around 04/28/2022).  ?We will start Mounjaro 5 mg weekly.  Information was given on this medication.  She has no history or family history of thyroid cancers.  Continue current medicines for hypertension.  ? ?Mliss Sax, MD ?

## 2022-01-26 NOTE — Patient Instructions (Signed)
With bariatric surgery because of scarring.  She would like to try Valturna ?

## 2022-01-29 NOTE — Telephone Encounter (Signed)
1st no show fee waived, letter sent KO ?

## 2022-02-03 ENCOUNTER — Other Ambulatory Visit: Payer: Self-pay | Admitting: Family Medicine

## 2022-02-03 DIAGNOSIS — F418 Other specified anxiety disorders: Secondary | ICD-10-CM

## 2022-02-09 ENCOUNTER — Other Ambulatory Visit: Payer: Self-pay

## 2022-02-10 ENCOUNTER — Other Ambulatory Visit: Payer: Self-pay | Admitting: Family Medicine

## 2022-02-10 DIAGNOSIS — Z794 Long term (current) use of insulin: Secondary | ICD-10-CM

## 2022-02-24 ENCOUNTER — Other Ambulatory Visit: Payer: Self-pay | Admitting: Family Medicine

## 2022-02-24 DIAGNOSIS — Z1231 Encounter for screening mammogram for malignant neoplasm of breast: Secondary | ICD-10-CM

## 2022-03-02 ENCOUNTER — Encounter: Payer: Self-pay | Admitting: Radiology

## 2022-03-02 ENCOUNTER — Ambulatory Visit
Admission: RE | Admit: 2022-03-02 | Discharge: 2022-03-02 | Disposition: A | Discharge status: Home / Self Care | Payer: No Typology Code available for payment source | Source: Ambulatory Visit | Attending: Family Medicine | Admitting: Family Medicine

## 2022-03-02 DIAGNOSIS — Z1231 Encounter for screening mammogram for malignant neoplasm of breast: Secondary | ICD-10-CM

## 2022-04-01 ENCOUNTER — Other Ambulatory Visit: Payer: Self-pay | Admitting: Family Medicine

## 2022-04-04 ENCOUNTER — Other Ambulatory Visit: Payer: Self-pay | Admitting: Family Medicine

## 2022-04-06 HISTORY — PX: BARIATRIC SURGERY: SHX1103

## 2022-04-07 ENCOUNTER — Other Ambulatory Visit: Payer: Self-pay

## 2022-04-07 DIAGNOSIS — Z794 Long term (current) use of insulin: Secondary | ICD-10-CM

## 2022-04-07 MED ORDER — TOUJEO SOLOSTAR 300 UNIT/ML ~~LOC~~ SOPN: PEN_INJECTOR | SUBCUTANEOUS | 0 refills | Status: DC

## 2022-04-13 ENCOUNTER — Other Ambulatory Visit: Payer: Self-pay

## 2022-04-13 ENCOUNTER — Other Ambulatory Visit: Payer: Self-pay | Admitting: Family Medicine

## 2022-04-13 DIAGNOSIS — E1169 Type 2 diabetes mellitus with other specified complication: Secondary | ICD-10-CM

## 2022-04-13 DIAGNOSIS — I10 Essential (primary) hypertension: Secondary | ICD-10-CM

## 2022-04-13 MED ORDER — LISINOPRIL-HYDROCHLOROTHIAZIDE 20-25 MG PO TABS: 1.0000 | ORAL_TABLET | Freq: Every day | ORAL | 0 refills | Status: DC

## 2022-04-18 ENCOUNTER — Other Ambulatory Visit: Payer: Self-pay | Admitting: Family Medicine

## 2022-04-18 DIAGNOSIS — E1169 Type 2 diabetes mellitus with other specified complication: Secondary | ICD-10-CM

## 2022-05-07 ENCOUNTER — Ambulatory Visit (INDEPENDENT_AMBULATORY_CARE_PROVIDER_SITE_OTHER): Payer: No Typology Code available for payment source | Admitting: Family Medicine

## 2022-05-07 ENCOUNTER — Encounter: Payer: Self-pay | Admitting: Family Medicine

## 2022-05-07 VITALS — BP 122/82 | HR 69 | Temp 96.8°F | Ht 62.0 in | Wt 223.0 lb

## 2022-05-07 DIAGNOSIS — I10 Essential (primary) hypertension: Secondary | ICD-10-CM | POA: Diagnosis not present

## 2022-05-07 DIAGNOSIS — D509 Iron deficiency anemia, unspecified: Secondary | ICD-10-CM | POA: Diagnosis not present

## 2022-05-07 DIAGNOSIS — E7800 Pure hypercholesterolemia, unspecified: Secondary | ICD-10-CM

## 2022-05-07 DIAGNOSIS — E1169 Type 2 diabetes mellitus with other specified complication: Secondary | ICD-10-CM

## 2022-05-07 DIAGNOSIS — Z794 Long term (current) use of insulin: Secondary | ICD-10-CM | POA: Diagnosis not present

## 2022-05-07 LAB — URINALYSIS, ROUTINE W REFLEX MICROSCOPIC
Bilirubin Urine: NEGATIVE
Hgb urine dipstick: NEGATIVE
Nitrite: NEGATIVE
Specific Gravity, Urine: 1.025 (ref 1.000–1.030)
Total Protein, Urine: NEGATIVE
Urine Glucose: NEGATIVE
Urobilinogen, UA: 0.2 (ref 0.0–1.0)
pH: 5.5 (ref 5.0–8.0)

## 2022-05-07 LAB — CBC
HCT: 37.5 % (ref 36.0–46.0)
Hemoglobin: 11.9 g/dL — ABNORMAL LOW (ref 12.0–15.0)
MCHC: 31.8 g/dL (ref 30.0–36.0)
MCV: 72.4 fl — ABNORMAL LOW (ref 78.0–100.0)
Platelets: 202 10*3/uL (ref 150.0–400.0)
RBC: 5.19 Mil/uL — ABNORMAL HIGH (ref 3.87–5.11)
RDW: 14.5 % (ref 11.5–15.5)
WBC: 7.6 10*3/uL (ref 4.0–10.5)

## 2022-05-07 LAB — COMPREHENSIVE METABOLIC PANEL
ALT: 11 U/L (ref 0–35)
AST: 16 U/L (ref 0–37)
Albumin: 4.3 g/dL (ref 3.5–5.2)
Alkaline Phosphatase: 43 U/L (ref 39–117)
BUN: 21 mg/dL (ref 6–23)
CO2: 30 mEq/L (ref 19–32)
Calcium: 9.6 mg/dL (ref 8.4–10.5)
Chloride: 102 mEq/L (ref 96–112)
Creatinine, Ser: 0.86 mg/dL (ref 0.40–1.20)
GFR: 71.33 mL/min (ref >60.00)
Glucose, Bld: 76 mg/dL (ref 70–99)
Potassium: 4.2 mEq/L (ref 3.5–5.1)
Sodium: 141 mEq/L (ref 135–145)
Total Bilirubin: 0.4 mg/dL (ref 0.2–1.2)
Total Protein: 6.6 g/dL (ref 6.0–8.3)

## 2022-05-07 LAB — MICROALBUMIN / CREATININE URINE RATIO
Creatinine,U: 169.7 mg/dL
Microalb Creat Ratio: 0.5 mg/g (ref 0.0–30.0)
Microalb, Ur: 0.9 mg/dL (ref 0.0–1.9)

## 2022-05-07 LAB — HEMOGLOBIN A1C: Hgb A1c MFr Bld: 6.9 % — ABNORMAL HIGH (ref 4.6–6.5)

## 2022-05-07 LAB — LIPID PANEL
Cholesterol: 142 mg/dL (ref 0–200)
HDL: 45.5 mg/dL (ref >39.00)
LDL Cholesterol: 82 mg/dL (ref 0–99)
NonHDL: 96.07
Total CHOL/HDL Ratio: 3
Triglycerides: 68 mg/dL (ref 0.0–149.0)
VLDL: 13.6 mg/dL (ref 0.0–40.0)

## 2022-05-07 LAB — VITAMIN B12: Vitamin B-12: 492 pg/mL (ref 211–911)

## 2022-05-07 NOTE — Progress Notes (Addendum)
Established Patient Office Visit  Subjective   Patient ID: Jillian Fowler, female    DOB: Jan 25, 1956  Age: 66 y.o. MRN: [id number]  Chief Complaint  Patient presents with   Office Visit    48month f/u DM    HPI follow-up of type 2 diabetes, hypertension, elevated ldl cholesterol and a microcytic anemia.  Denies hematuria, melena or hematochezia.  Colonoscopy is up-to-date.  Last LDL cholesterol was 120.  She is fasting this morning blood pressure well controlled with Zestoretic.  Has been able to lose 20 pounds since her sleeve gastrectomy on 6/5.  Morning blood sugars have often been in the < 120 range.  She is down to 40 units of Toujeo each evening from 65.  Continues with Greggory Keen it is working well for her.  Exercising daily by walking a mile and a half and swimming.    Review of Systems  Constitutional:  Negative for chills, diaphoresis, malaise/fatigue and weight loss.  HENT: Negative.    Eyes: Negative.  Negative for blurred vision and double vision.  Cardiovascular:  Negative for chest pain.  Gastrointestinal:  Negative for abdominal pain, blood in stool and melena.  Genitourinary: Negative.  Negative for hematuria.  Musculoskeletal:  Negative for falls and myalgias.  Neurological:  Negative for speech change, loss of consciousness and weakness.  Psychiatric/Behavioral: Negative.        Objective:     BP 122/82 (BP Location: Right Arm, Patient Position: Sitting, Cuff Size: Normal)   Pulse 69   Temp (!) 96.8 F (36 C) (Temporal)   Ht 5\' 2"  (1.575 m)   Wt 223 lb (101.2 kg)   SpO2 98%   BMI 40.79 kg/m  BP Readings from Last 3 Encounters:  05/07/22 122/82  01/26/22 132/70  11/20/21 122/70   Wt Readings from Last 3 Encounters:  05/07/22 223 lb (101.2 kg)  01/26/22 243 lb 6.4 oz (110.4 kg)  11/20/21 242 lb 6.4 oz (110 kg)      Physical Exam Constitutional:      General: She is not in acute distress.    Appearance: Normal appearance. She is not  ill-appearing, toxic-appearing or diaphoretic.  HENT:     Head: Normocephalic and atraumatic.     Right Ear: External ear normal.     Left Ear: External ear normal.     Mouth/Throat:     Mouth: Mucous membranes are moist.     Pharynx: Oropharynx is clear. No oropharyngeal exudate or posterior oropharyngeal erythema.  Eyes:     General: No scleral icterus.       Right eye: No discharge.        Left eye: No discharge.     Extraocular Movements: Extraocular movements intact.     Conjunctiva/sclera: Conjunctivae normal.     Pupils: Pupils are equal, round, and reactive to light.  Cardiovascular:     Rate and Rhythm: Normal rate and regular rhythm.  Pulmonary:     Effort: Pulmonary effort is normal. No respiratory distress.     Breath sounds: Normal breath sounds.  Musculoskeletal:     Cervical back: No rigidity or tenderness.  Skin:    General: Skin is warm and dry.  Neurological:     Mental Status: She is alert and oriented to person, place, and time.  Psychiatric:        Mood and Affect: Mood normal.        Behavior: Behavior normal.    Diabetic Foot Exam - Simple   Simple  Foot Form Diabetic Foot exam was performed with the following findings: Yes 05/07/2022  8:35 AM  Visual Inspection See comments: Yes Sensation Testing Intact to touch and monofilament testing bilaterally: Yes Pulse Check Posterior Tibialis and Dorsalis pulse intact bilaterally: Yes Comments Feet are cavus bilaterally       No results found for any visits on 05/07/22.    The 10-year ASCVD risk score (Arnett DK, et al., 2019) is: 16.7%    Assessment & Plan:   Problem List Items Addressed This Visit       Cardiovascular and Mediastinum   Essential hypertension   Relevant Orders   CBC   Urinalysis, Routine w reflex microscopic   Microalbumin / creatinine urine ratio     Endocrine   Diabetes mellitus (HCC) - Primary   Relevant Orders   Comprehensive metabolic panel   Hemoglobin A1c    Urinalysis, Routine w reflex microscopic   Microalbumin / creatinine urine ratio     Other   Elevated LDL cholesterol level   Relevant Orders   Comprehensive metabolic panel   Lipid panel   Microcytic anemia   Relevant Medications   ferrous sulfate 325 (65 FE) MG tablet   Other Relevant Orders   CBC   Vitamin B12   Iron, TIBC and Ferritin Panel    Return in about 6 months (around 11/07/2022).   May continue titrating glargine down until fasting a.m. sugars are averaging greater than 90 and less than 130.  Continue weight loss efforts and exercise.  Patient will schedule her own eye check.  Discussed increasing simvastatin as needed today's LDL cholesterol Mliss Sax, MD

## 2022-05-08 LAB — IRON,TIBC AND FERRITIN PANEL
%SAT: 24 % (calc) (ref 16–45)
Ferritin: 67 ng/mL (ref 16–288)
Iron: 62 ug/dL (ref 45–160)
TIBC: 259 mcg/dL (calc) (ref 250–450)

## 2022-05-17 ENCOUNTER — Other Ambulatory Visit: Payer: Self-pay | Admitting: Family Medicine

## 2022-05-17 DIAGNOSIS — E1169 Type 2 diabetes mellitus with other specified complication: Secondary | ICD-10-CM

## 2022-05-18 ENCOUNTER — Other Ambulatory Visit: Payer: Self-pay | Admitting: Family Medicine

## 2022-05-18 DIAGNOSIS — F418 Other specified anxiety disorders: Secondary | ICD-10-CM

## 2022-05-18 MED ORDER — CITALOPRAM HYDROBROMIDE 20 MG PO TABS: 20.0000 mg | ORAL_TABLET | Freq: Every day | ORAL | 1 refills | Status: DC

## 2022-05-18 NOTE — Telephone Encounter (Signed)
Caller Name: [facility] Call back phone #: [phone]  MEDICATION(S): Citalopram     Has the patient contacted their pharmacy (YES/NO)?  yes IF YES, when and what did the pharmacy advise? Pharmacy called on behalf of pt IF NO, request that the patient contact the pharmacy for the refills in the future.             The pharmacy will send an electronic request (except for controlled medications).  Preferred Pharmacy: [facility] - [place], [place] - [address]  ~~~Please advise patient/caregiver to allow 2-3 business days to process RX refills.

## 2022-05-18 NOTE — Telephone Encounter (Signed)
Refill request for pending Rx last OV 05/07/22 last refill 02/04/22 please advise.

## 2022-05-21 ENCOUNTER — Telehealth: Payer: Self-pay | Admitting: Family Medicine

## 2022-05-21 NOTE — Telephone Encounter (Signed)
Pharamcy called in for Dr. Doreene Burke to send over the prescription for citalopram 20mg . they did not receive, also sent a fax to [facility].

## 2022-05-25 NOTE — Telephone Encounter (Signed)
Called pharmacy spoke with pharmacist who states that patient picked up Rx on 05/18/22 same day Rx was sent in.

## 2022-06-01 ENCOUNTER — Encounter: Payer: Self-pay | Admitting: Family Medicine

## 2022-06-01 NOTE — Telephone Encounter (Signed)
Pharmacy called stating they never filled the prescription. I read her the notes that was put in here by [facility]. She asked for a call back @[phone]

## 2022-06-02 NOTE — Telephone Encounter (Signed)
Patients prescription was sent to [facility] on 05/18/22 with 90 day supply and picked up on 05/20/22 patient aware that she will need to call in October when she's low to have refill sent to [facility] pharmacy instead of [facility].

## 2022-06-03 ENCOUNTER — Ambulatory Visit (INDEPENDENT_AMBULATORY_CARE_PROVIDER_SITE_OTHER): Payer: No Typology Code available for payment source | Admitting: Family Medicine

## 2022-06-03 ENCOUNTER — Encounter: Payer: Self-pay | Admitting: Family Medicine

## 2022-06-03 VITALS — BP 142/80 | HR 64 | Temp 97.3°F | Ht 62.0 in | Wt 216.4 lb

## 2022-06-03 DIAGNOSIS — R42 Dizziness and giddiness: Secondary | ICD-10-CM

## 2022-06-03 DIAGNOSIS — E611 Iron deficiency: Secondary | ICD-10-CM | POA: Diagnosis not present

## 2022-06-03 DIAGNOSIS — D509 Iron deficiency anemia, unspecified: Secondary | ICD-10-CM

## 2022-06-03 DIAGNOSIS — I10 Essential (primary) hypertension: Secondary | ICD-10-CM

## 2022-06-03 DIAGNOSIS — E7800 Pure hypercholesterolemia, unspecified: Secondary | ICD-10-CM | POA: Diagnosis not present

## 2022-06-03 LAB — URINALYSIS, ROUTINE W REFLEX MICROSCOPIC
Bilirubin Urine: NEGATIVE
Hgb urine dipstick: NEGATIVE
Ketones, ur: NEGATIVE
Nitrite: NEGATIVE
Specific Gravity, Urine: 1.015 (ref 1.000–1.030)
Total Protein, Urine: NEGATIVE
Urine Glucose: NEGATIVE
Urobilinogen, UA: 0.2 (ref 0.0–1.0)
pH: 7.5 (ref 5.0–8.0)

## 2022-06-03 MED ORDER — FERROUS SULFATE 325 (65 FE) MG PO TABS: 325.0000 mg | ORAL_TABLET | Freq: Every day | ORAL | 1 refills | Status: DC

## 2022-06-03 MED ORDER — SIMVASTATIN 40 MG PO TABS: 40.0000 mg | ORAL_TABLET | Freq: Every day | ORAL | 3 refills | Status: DC

## 2022-06-03 MED ORDER — MULTI-VITAMIN PO TABS: 1.0000 | ORAL_TABLET | Freq: Every day | ORAL | 3 refills | Status: DC

## 2022-06-03 NOTE — Progress Notes (Signed)
Established Patient Office Visit  Subjective   Patient ID: Jillian Fowler, female    DOB: 05/23/1956  Age: 66 y.o. MRN: [id number]  Chief Complaint  Patient presents with   Dizziness    Dizziness x 2 days little better this morning.     Dizziness Pertinent negatives include no abdominal pain, chest pain, chills, diaphoresis, myalgias or weakness.   evaluation of a 2 to 3-day history of lightheadedness.  She notices a sinking feeling when she stands sometimes.  Denies any type of spinning sensation.  She is down to 25 units of glargine with fasting morning sugars running in the 60-80 range.  Has been able to lose another 7 pounds since her last visit!    Review of Systems  Constitutional:  Negative for chills, diaphoresis, malaise/fatigue and weight loss.  HENT: Negative.    Eyes: Negative.  Negative for blurred vision and double vision.  Cardiovascular:  Negative for chest pain.  Gastrointestinal:  Negative for abdominal pain.  Genitourinary: Negative.   Musculoskeletal:  Negative for falls and myalgias.  Neurological:  Negative for dizziness, speech change, loss of consciousness and weakness.  Psychiatric/Behavioral: Negative.        Objective:     BP (!) 142/80 (BP Location: Right Arm, Patient Position: Sitting, Cuff Size: Normal)   Pulse 64   Temp (!) 97.3 F (36.3 C) (Temporal)   Ht 5\' 2"  (1.575 m)   Wt 216 lb 6.4 oz (98.2 kg)   SpO2 97%   BMI 39.58 kg/m  BP Readings from Last 3 Encounters:  06/03/22 (!) 142/80  05/07/22 122/82  01/26/22 132/70   Wt Readings from Last 3 Encounters:  06/03/22 216 lb 6.4 oz (98.2 kg)  05/07/22 223 lb (101.2 kg)  01/26/22 243 lb 6.4 oz (110.4 kg)      Physical Exam Constitutional:      General: She is not in acute distress.    Appearance: Normal appearance. She is not ill-appearing, toxic-appearing or diaphoretic.  HENT:     Head: Normocephalic and atraumatic.     Right Ear: External ear normal.     Left Ear: External  ear normal.     Mouth/Throat:     Mouth: Mucous membranes are dry.     Pharynx: Oropharynx is clear. No oropharyngeal exudate or posterior oropharyngeal erythema.  Eyes:     General: No scleral icterus.       Right eye: No discharge.        Left eye: No discharge.     Extraocular Movements: Extraocular movements intact.     Conjunctiva/sclera: Conjunctivae normal.     Pupils: Pupils are equal, round, and reactive to light.  Cardiovascular:     Rate and Rhythm: Normal rate and regular rhythm.  Pulmonary:     Effort: Pulmonary effort is normal. No respiratory distress.     Breath sounds: Normal breath sounds.  Abdominal:     General: Bowel sounds are normal.  Musculoskeletal:     Cervical back: No rigidity or tenderness.  Skin:    General: Skin is warm and dry.     Comments: Skin turgor is normal  Neurological:     Mental Status: She is alert and oriented to person, place, and time.  Psychiatric:        Mood and Affect: Mood normal.        Behavior: Behavior normal.      No results found for any visits on 06/03/22.    The 10-year ASCVD  risk score (Arnett DK, et al., 2019) is: 19.5%    Assessment & Plan:   Problem List Items Addressed This Visit       Cardiovascular and Mediastinum   Essential hypertension - Primary   Relevant Medications   simvastatin (ZOCOR) 40 MG tablet     Other   Elevated LDL cholesterol level   Relevant Medications   simvastatin (ZOCOR) 40 MG tablet   Microcytic anemia   Relevant Medications   Multiple Vitamin (MULTI-VITAMIN) tablet   ferrous sulfate 325 (65 FE) MG tablet   Lightheadedness   Relevant Orders   Urinalysis, Routine w reflex microscopic   Orthostatic vital signs   Other Visit Diagnoses     Iron deficiency       Relevant Medications   ferrous sulfate 325 (65 FE) MG tablet       Return if symptoms worsen or fail to improve.  We will check and record blood pressures.  Pressure today is elevated but she may be  experiencing true hypotension due to overmedication with her weight loss.  Restart iron once daily.  Have increased simvastatin to 40 mg daily.  Encouraged her to hydrate well.  She did not tilt with orthostatics.  Suspect dehydration.  Will check UA today.  At some point may need to adjust her BP medicines  Mliss Sax, MD

## 2022-06-08 ENCOUNTER — Telehealth: Payer: Self-pay | Admitting: Family Medicine

## 2022-06-08 DIAGNOSIS — Z794 Long term (current) use of insulin: Secondary | ICD-10-CM

## 2022-06-08 MED ORDER — METFORMIN HCL 500 MG PO TABS: 500.0000 mg | ORAL_TABLET | Freq: Two times a day (BID) | ORAL | 3 refills | Status: DC

## 2022-06-08 NOTE — Telephone Encounter (Signed)
Caller Name: Jillian Fowler Call back phone #: [phone]  MEDICATION(S): metFORMIN (GLUCOPHAGE) 500 MG tablet  Days of Med Remaining:   Has the patient contacted their pharmacy (YES/NO)?  What did pharmacy advise?  Preferred Pharmacy:  [facility] - [place], [place] - [address] Phone:  [phone]      ~~~Please advise patient/caregiver to allow 2-3 business days to process RX refills.   Pt went to pharmacy and they told pt they did not see a order for metformin. Pt stated that she need a order put in for this

## 2022-07-01 ENCOUNTER — Encounter: Payer: Self-pay | Admitting: Podiatry

## 2022-07-01 ENCOUNTER — Ambulatory Visit (INDEPENDENT_AMBULATORY_CARE_PROVIDER_SITE_OTHER): Payer: No Typology Code available for payment source | Admitting: Podiatry

## 2022-07-01 ENCOUNTER — Ambulatory Visit (INDEPENDENT_AMBULATORY_CARE_PROVIDER_SITE_OTHER): Payer: No Typology Code available for payment source

## 2022-07-01 DIAGNOSIS — L84 Corns and callosities: Secondary | ICD-10-CM

## 2022-07-01 DIAGNOSIS — M7751 Other enthesopathy of right foot: Secondary | ICD-10-CM

## 2022-07-01 DIAGNOSIS — M21621 Bunionette of right foot: Secondary | ICD-10-CM

## 2022-07-01 DIAGNOSIS — M7752 Other enthesopathy of left foot: Secondary | ICD-10-CM

## 2022-07-01 DIAGNOSIS — M21622 Bunionette of left foot: Secondary | ICD-10-CM

## 2022-07-01 MED ORDER — TRIAMCINOLONE ACETONIDE 10 MG/ML IJ SUSP
20.0000 mg | Freq: Once | INTRAMUSCULAR | Status: AC
  Administered 2022-07-01: 20 mg

## 2022-07-01 NOTE — Progress Notes (Signed)
Subjective:   Patient ID: Jillian Fowler, female   DOB: 65 y.o.   MRN: [id number]   HPI Patient presents with painful calluses on both feet and states that it makes it hard for her to walk and she feels like there may be lesions also.  Patient does not smoke likes to be active   Review of Systems  All other systems reviewed and are negative.       Objective:  Physical Exam Vitals and nursing note reviewed.  Constitutional:      Appearance: She is well-developed.  Pulmonary:     Effort: Pulmonary effort is normal.  Musculoskeletal:        General: Normal range of motion.  Skin:    General: Skin is warm.  Neurological:     Mental Status: She is alert.     Neurovascular status intact muscle strength adequate range of motion adequate with inflammation around the head of the fifth metatarsal bilateral with fluid buildup around the area with keratotic lesion formation painful when pressed with patient found to have good digital perfusion well oriented x3 with pain left over right.     Assessment:  Inflammatory capsulitis head of fifth MPJ bilateral with lesion formation and probable structural changes     Plan:  H&P reviewed condition appears that the lesions may be slightly distal to the metatarsal head but still in the proximity of the fifth metatarsal head with inflammatory capsulitis of the fifth MPJ bilateral.  I went ahead today did sterile prep injected the capsule plantarly 3 mg dexamethasone Kenalog 5 mg Xylocaine debrided the lesion no angiogenic bleeding reappoint for routine care may require metatarsal head resection and I made her aware of today  X-rays indicate that there is pressure against the metatarsal head and distal to the point

## 2022-07-16 ENCOUNTER — Other Ambulatory Visit: Payer: Self-pay | Admitting: Family Medicine

## 2022-07-16 DIAGNOSIS — I10 Essential (primary) hypertension: Secondary | ICD-10-CM

## 2022-07-16 DIAGNOSIS — Z794 Long term (current) use of insulin: Secondary | ICD-10-CM

## 2022-08-21 ENCOUNTER — Other Ambulatory Visit: Payer: Self-pay | Admitting: Family Medicine

## 2022-08-21 DIAGNOSIS — F418 Other specified anxiety disorders: Secondary | ICD-10-CM

## 2022-08-21 NOTE — Telephone Encounter (Signed)
Caller Name: alissia Call back phone #: [phone]   MEDICATION(S):  citalopram (CELEXA) 20 MG tablet and omeprazole (PRILOSEC) 20 MG capsule  Days of Med Remaining:   Has the patient contacted their pharmacy (YES/NO)? no What did pharmacy advise?   Preferred Pharmacy:  [facility] - [place], [place] - [address] Phone:  [phone]      ~~~Please advise patient/caregiver to allow 2-3 business days to process RX refills.

## 2022-08-25 MED ORDER — CITALOPRAM HYDROBROMIDE 20 MG PO TABS: 20.0000 mg | ORAL_TABLET | Freq: Every day | ORAL | 1 refills | Status: DC

## 2022-08-25 NOTE — Telephone Encounter (Signed)
Refill request for pending Rx last OV 06/03/22 last refill 05/19/22 please advise

## 2022-09-11 ENCOUNTER — Other Ambulatory Visit: Payer: Self-pay | Admitting: Family Medicine

## 2022-09-11 DIAGNOSIS — E1169 Type 2 diabetes mellitus with other specified complication: Secondary | ICD-10-CM

## 2022-09-22 ENCOUNTER — Telehealth: Payer: Self-pay | Admitting: Family Medicine

## 2022-09-22 DIAGNOSIS — E1169 Type 2 diabetes mellitus with other specified complication: Secondary | ICD-10-CM

## 2022-09-22 NOTE — Telephone Encounter (Signed)
Caller Name: pt  Call back phone #: [phone]    MEDICATION(S):  MOUNJARO 5 MG/0.5ML Pen [[id number]]   Days of Med Remaining: none  Has the patient contacted their pharmacy (YES/NO)? Yes contact your PCP   Preferred Pharmacy:  [facility] [address], [place] - [address] [place] [address] Phone: [phone]  Fax: [phone]

## 2022-09-23 MED ORDER — MOUNJARO 5 MG/0.5ML ~~LOC~~ SOAJ: 5.0000 mg | SUBCUTANEOUS | 3 refills | Status: DC

## 2022-10-28 ENCOUNTER — Encounter: Payer: Self-pay | Admitting: Family Medicine

## 2022-10-28 ENCOUNTER — Ambulatory Visit (INDEPENDENT_AMBULATORY_CARE_PROVIDER_SITE_OTHER): Payer: No Typology Code available for payment source | Admitting: Family Medicine

## 2022-10-28 VITALS — BP 120/70 | HR 67 | Temp 97.4°F | Ht 62.0 in | Wt 197.6 lb

## 2022-10-28 DIAGNOSIS — M791 Myalgia, unspecified site: Secondary | ICD-10-CM

## 2022-10-28 MED ORDER — NAPROXEN 500 MG PO TABS: 500.0000 mg | ORAL_TABLET | Freq: Two times a day (BID) | ORAL | 0 refills | Status: DC

## 2022-10-28 NOTE — Progress Notes (Signed)
[facility] [address] [place] [address] Dept: [phone] Dept Fax: [phone]  Office Visit  Subjective:    Patient ID: Jillian Fowler, female    DOB: [birth year], 66 y.o..   MRN: [id number]  Chief Complaint  Patient presents with   Acute Visit    Co having upper back/shoulder pain x 3 weeks.      History of Present Illness:  Patient is in today complaining of pain and tightness in the muscles of the upper back for the past 3 weeks. She denies any known trauma or inciting event. 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 She has not tried any specific therapies for this.   Past Medical History: Patient Active Problem List   Diagnosis Date Noted   Lightheadedness 06/03/2022   Microcytic anemia 05/07/2022   Class 3 severe obesity due to excess calories with serious comorbidity and body mass index (BMI) of 40.0 to 44.9 in adult (HCC) 11/24/2021   Hyperlipidemia 09/04/2021   Elevated LDL cholesterol level 06/11/2021   Healthcare maintenance 06/11/2021   Essential hypertension 04/19/2020   Diabetes mellitus (HCC) 04/19/2020   Morbid obesity (HCC) 04/19/2020   Heart murmur 04/19/2020   Thyroid nodule 04/19/2020   Depression with anxiety 04/19/2020   Primary insomnia 04/19/2020   Past Surgical History:  Procedure Laterality Date   BARIATRIC SURGERY  04/06/2022   EYE SURGERY  12/2020   UTERINE FIBROID SURGERY     Family History  Problem Relation Age of Onset   Hypertension Mother    Diabetes Mother    Arthritis Mother    Hypertension Father    Diabetes Father    Outpatient Medications Prior to Visit  Medication Sig Dispense Refill   acetaminophen (TYLENOL) 325 MG tablet Take by mouth.     citalopram (CELEXA) 20 MG tablet Take 1 tablet (20 mg total) by mouth daily. 90 tablet 1   ferrous sulfate 325 (65 FE) MG tablet Take 1 tablet (325 mg total) by mouth  daily. 90 tablet 1   insulin glargine, 1 Unit Dial, (TOUJEO SOLOSTAR) 300 UNIT/ML Solostar Pen INJECT 65 UNITS SUBCUTANEOUSLY AT BEDTIME (Patient taking differently: Inject 40 Units into the skin daily. INJECT 65 UNITS SUBCUTANEOUSLY AT BEDTIME) 6 mL 0   lisinopril-hydrochlorothiazide (ZESTORETIC) 20-25 MG tablet TAKE 1 TABLET BY MOUTH EVERY DAY 90 tablet 0   metFORMIN (GLUCOPHAGE) 500 MG tablet Take 1 tablet (500 mg total) by mouth 2 (two) times daily with a meal. 180 tablet 3   MOUNJARO 5 MG/0.5ML Pen Inject 5 mg into the skin once a week. 4 mL 3   Multiple Vitamin (MULTI-VITAMIN) tablet Take 1 tablet by mouth daily. 90 tablet 3   omeprazole (PRILOSEC) 20 MG capsule Take 20 mg by mouth daily.     simvastatin (ZOCOR) 40 MG tablet Take 1 tablet (40 mg total) by mouth at bedtime. 90 tablet 3   ursodiol (ACTIGALL) 250 MG tablet Take 250 mg by mouth 2 (two) times daily.     No facility-administered medications prior to visit.   No Known Allergies    Objective:   Today's Vitals   10/28/22 1613  BP: 120/70  Pulse: 67  Temp: (!) 97.4 F (36.3 C)  TempSrc: Temporal  SpO2: 98%  Weight: 197 lb 9.6 oz (89.6 kg)  Height: 5\' 2"  (1.575 m)   Body mass index is 36.14 kg/m.   General: Well developed, well nourished.  No acute distress. Back: Pain indicated over the upper back in the trapezius muscle distribution. There is some pain with   palpation, though the patient also finds deeper pressure helps. Psych: Alert and oriented. Normal mood and affect.  Health Maintenance Due  Topic Date Due   PAP SMEAR-Modifier  Never done   OPHTHALMOLOGY EXAM  12/31/2021   DEXA SCAN  Never done     Assessment & Plan:   1. Muscle tension pain Ms. Browder is having some muscle tension issues in her trapezius muscles. I advised:  Use heat with stretches to the upper back/neck. Consider having a therapeutic massage. Take naproxen 500 mg twice a day for 7 days.If not improved, follow up to consider a  physical therapy referral.  - naproxen (NAPROSYN) 500 MG tablet; Take 1 tablet (500 mg total) by mouth 2 (two) times daily with a meal.  Dispense: 14 tablet; Refill: 0  Return if symptoms worsen or fail to improve.   Loyola Mast, MD

## 2022-10-28 NOTE — Patient Instructions (Signed)
Use heat with stretches to the upper back/neck. Consider having a therapeutic massage. Take naproxen 500 mg twice a day for 7 days.If not improved, follow up to consider a physical therapy referral.

## 2022-10-29 ENCOUNTER — Other Ambulatory Visit: Payer: Self-pay | Admitting: Family Medicine

## 2022-10-29 DIAGNOSIS — I10 Essential (primary) hypertension: Secondary | ICD-10-CM

## 2022-10-29 DIAGNOSIS — E1169 Type 2 diabetes mellitus with other specified complication: Secondary | ICD-10-CM

## 2022-12-10 ENCOUNTER — Encounter: Payer: Self-pay | Admitting: Family Medicine

## 2022-12-10 ENCOUNTER — Ambulatory Visit (INDEPENDENT_AMBULATORY_CARE_PROVIDER_SITE_OTHER): Payer: Commercial Managed Care - PPO | Admitting: Family Medicine

## 2022-12-10 VITALS — BP 118/66 | HR 68 | Temp 97.9°F | Ht 62.0 in | Wt 197.8 lb

## 2022-12-10 DIAGNOSIS — E1169 Type 2 diabetes mellitus with other specified complication: Secondary | ICD-10-CM | POA: Diagnosis not present

## 2022-12-10 DIAGNOSIS — E611 Iron deficiency: Secondary | ICD-10-CM | POA: Diagnosis not present

## 2022-12-10 DIAGNOSIS — E7800 Pure hypercholesterolemia, unspecified: Secondary | ICD-10-CM | POA: Diagnosis not present

## 2022-12-10 DIAGNOSIS — Z794 Long term (current) use of insulin: Secondary | ICD-10-CM | POA: Diagnosis not present

## 2022-12-10 LAB — URINALYSIS, ROUTINE W REFLEX MICROSCOPIC
Bilirubin Urine: NEGATIVE
Hgb urine dipstick: NEGATIVE
Ketones, ur: NEGATIVE
Nitrite: NEGATIVE
Specific Gravity, Urine: 1.025 (ref 1.000–1.030)
Total Protein, Urine: NEGATIVE
Urine Glucose: NEGATIVE
Urobilinogen, UA: 0.2 (ref 0.0–1.0)
pH: 5.5 (ref 5.0–8.0)

## 2022-12-10 LAB — MICROALBUMIN / CREATININE URINE RATIO
Creatinine,U: 114.1 mg/dL
Microalb Creat Ratio: 0.6 mg/g (ref 0.0–30.0)
Microalb, Ur: 0.7 mg/dL (ref 0.0–1.9)

## 2022-12-10 LAB — BASIC METABOLIC PANEL
BUN: 17 mg/dL (ref 6–23)
CO2: 31 mEq/L (ref 19–32)
Calcium: 9.1 mg/dL (ref 8.4–10.5)
Chloride: 102 mEq/L (ref 96–112)
Creatinine, Ser: 0.79 mg/dL (ref 0.40–1.20)
GFR: 78.65 mL/min (ref >60.00)
Glucose, Bld: 114 mg/dL — ABNORMAL HIGH (ref 70–99)
Potassium: 4.2 mEq/L (ref 3.5–5.1)
Sodium: 141 mEq/L (ref 135–145)

## 2022-12-10 LAB — CBC
HCT: 38 % (ref 36.0–46.0)
Hemoglobin: 12.3 g/dL (ref 12.0–15.0)
MCHC: 32.3 g/dL (ref 30.0–36.0)
MCV: 73.1 fl — ABNORMAL LOW (ref 78.0–100.0)
Platelets: 221 10*3/uL (ref 150.0–400.0)
RBC: 5.2 Mil/uL — ABNORMAL HIGH (ref 3.87–5.11)
RDW: 14.3 % (ref 11.5–15.5)
WBC: 8.5 10*3/uL (ref 4.0–10.5)

## 2022-12-10 LAB — LDL CHOLESTEROL, DIRECT: Direct LDL: 168 mg/dL

## 2022-12-10 LAB — HEMOGLOBIN A1C: Hgb A1c MFr Bld: 6.7 % — ABNORMAL HIGH (ref 4.6–6.5)

## 2022-12-10 MED ORDER — METFORMIN HCL 500 MG PO TABS: 1000.0000 mg | ORAL_TABLET | Freq: Two times a day (BID) | ORAL | 3 refills | Status: DC

## 2022-12-10 MED ORDER — PRAVASTATIN SODIUM 20 MG PO TABS: 20.0000 mg | ORAL_TABLET | Freq: Every evening | ORAL | 1 refills | Status: DC

## 2022-12-10 NOTE — Progress Notes (Signed)
Established Patient Office Visit   Subjective:  Patient ID: Jillian Fowler, female    DOB: 04-22-1956  Age: 67 y.o. MRN: [id number]  Chief Complaint  Patient presents with   Follow-up    Routine follow up on diabetes patient not sure which diabetes medication she should be taking out of Mounjaro x 1 week. Patient not fasting.     HPI Encounter Diagnoses  Name Primary?   Elevated LDL cholesterol level Yes   Type 2 diabetes mellitus with other specified complication, with long-term current use of insulin (HCC)    Morbid obesity (HCC)    Iron deficiency    For follow-up of above.  Recent difficulty obtaining Mounjaro.  She has been exercising most days of the week.  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  She is taking her iron as directed.    Review of Systems  Constitutional: Negative.   HENT: Negative.    Eyes:  Negative for blurred vision, discharge and redness.  Respiratory: Negative.    Cardiovascular: Negative.   Gastrointestinal:  Negative for abdominal pain, blood in stool and melena.  Genitourinary: Negative.  Negative for hematuria.  Musculoskeletal: Negative.  Negative for myalgias.  Skin:  Negative for rash.  Neurological:  Negative for tingling, loss of consciousness and weakness.  Endo/Heme/Allergies:  Negative for polydipsia.     Current Outpatient Medications:    citalopram (CELEXA) 20 MG tablet, Take 1 tablet (20 mg total) by mouth daily., Disp: 90 tablet, Rfl: 1   ferrous sulfate 325 (65 FE) MG tablet, Take 1 tablet (325 mg total) by mouth daily., Disp: 90 tablet, Rfl: 1   lisinopril-hydrochlorothiazide (ZESTORETIC) 20-25 MG tablet, TAKE 1 TABLET BY MOUTH EVERY DAY, Disp: 90 tablet, Rfl: 0   Multiple Vitamin (MULTI-VITAMIN) tablet, Take 1 tablet by mouth daily., Disp: 90 tablet, Rfl: 3   pravastatin (PRAVACHOL) 20 MG tablet, Take 1 tablet (20 mg total) by mouth at bedtime., Disp: 90 tablet, Rfl: 1    insulin glargine, 1 Unit Dial, (TOUJEO SOLOSTAR) 300 UNIT/ML Solostar Pen, INJECT 65 UNITS SUBCUTANEOUSLY AT BEDTIME (Patient not taking: Reported on 12/10/2022), Disp: 6 mL, Rfl: 0   metFORMIN (GLUCOPHAGE) 500 MG tablet, Take 2 tablets (1,000 mg total) by mouth 2 (two) times daily with a meal., Disp: 360 tablet, Rfl: 3   MOUNJARO 5 MG/0.5ML Pen, Inject 5 mg into the skin once a week. (Patient not taking: Reported on 12/10/2022), Disp: 4 mL, Rfl: 3   omeprazole (PRILOSEC) 20 MG capsule, Take 20 mg by mouth daily. (Patient not taking: Reported on 12/10/2022), Disp: , Rfl:    Objective:     BP 118/66 (BP Location: Left Arm, Patient Position: Sitting, Cuff Size: Normal)   Pulse 68   Temp 97.9 F (36.6 C) (Temporal)   Ht 5\' 2"  (1.575 m)   Wt 197 lb 12.8 oz (89.7 kg)   SpO2 96%   BMI 36.18 kg/m  BP Readings from Last 3 Encounters:  12/10/22 118/66  10/28/22 120/70  06/03/22 (!) 142/80   Wt Readings from Last 3 Encounters:  12/10/22 197 lb 12.8 oz (89.7 kg)  10/28/22 197 lb 9.6 oz (89.6 kg)  06/03/22 216 lb 6.4 oz (98.2 kg)      Physical Exam Constitutional:      General: She is not in acute distress.    Appearance: Normal appearance. She is not ill-appearing, toxic-appearing or diaphoretic.  HENT:     Head: Normocephalic and atraumatic.  Right Ear: Tympanic membrane, ear canal and external ear normal.     Left Ear: Tympanic membrane, ear canal and external ear normal.     Mouth/Throat:     Mouth: Mucous membranes are moist.     Pharynx: Oropharynx is clear. No oropharyngeal exudate or posterior oropharyngeal erythema.  Eyes:     General: No scleral icterus.       Right eye: No discharge.        Left eye: No discharge.     Extraocular Movements: Extraocular movements intact.     Conjunctiva/sclera: Conjunctivae normal.     Pupils: Pupils are equal, round, and reactive to light.  Cardiovascular:     Rate and Rhythm: Normal rate and regular rhythm.  Pulmonary:     Effort:  Pulmonary effort is normal. No respiratory distress.     Breath sounds: Normal breath sounds.  Abdominal:     General: Bowel sounds are normal.     Tenderness: There is no abdominal tenderness. There is no guarding.  Musculoskeletal:     Cervical back: No rigidity or tenderness.  Skin:    General: Skin is warm and dry.  Neurological:     Mental Status: She is alert and oriented to person, place, and time.  Psychiatric:        Mood and Affect: Mood normal.        Behavior: Behavior normal.      No results found for any visits on 12/10/22.    The 10-year ASCVD risk score (Arnett DK, et al., 2019) is: 13.3%    Assessment & Plan:   Elevated LDL cholesterol level -     Pravastatin Sodium; Take 1 tablet (20 mg total) by mouth at bedtime.  Dispense: 90 tablet; Refill: 1 -     LDL cholesterol, direct  Type 2 diabetes mellitus with other specified complication, with long-term current use of insulin (HCC) -     Basic metabolic panel -     Hemoglobin A1c -     metFORMIN HCl; Take 2 tablets (1,000 mg total) by mouth 2 (two) times daily with a meal.  Dispense: 360 tablet; Refill: 3 -     Microalbumin / creatinine urine ratio -     Urinalysis, Routine w reflex microscopic -     Ambulatory referral to Ophthalmology  Morbid obesity ([place])  Iron deficiency -     CBC -     Iron, TIBC and Ferritin Panel    Return in about 6 months (around 06/10/2023), or if symptoms worsen or fail to improve.  Will be checking in with her insurance for [facility] coverage.  Have increased metformin to 1000 twice daily.  Encouraged ongoing healthy lifestyle with exercise and continue working with bariatric clinic.  Will try Pravachol for cholesterol.  Could consider lipid clinic. Libby Maw, MD

## 2022-12-11 LAB — IRON,TIBC AND FERRITIN PANEL
%SAT: 32 % (calc) (ref 16–45)
Ferritin: 59 ng/mL (ref 16–288)
Iron: 83 ug/dL (ref 45–160)
TIBC: 256 mcg/dL (calc) (ref 250–450)

## 2023-02-19 IMAGING — MG MM DIGITAL SCREENING BILAT W/ TOMO AND CAD
8 of 14 series · 8 of 40 positions shown · non-contrast
Comparison: None.

ACR Breast Density Category a: The breast tissue is almost entirely
fatty.

CLINICAL DATA: Screening.

EXAM:
DIGITAL SCREENING BILATERAL MAMMOGRAM WITH TOMOSYNTHESIS AND CAD
TECHNIQUE: Bilateral screening digital craniocaudal and mediolateral oblique
mammograms were obtained. Bilateral screening digital breast
tomosynthesis was performed. The images were evaluated with
computer-aided detection.

[L CC synth-2D]
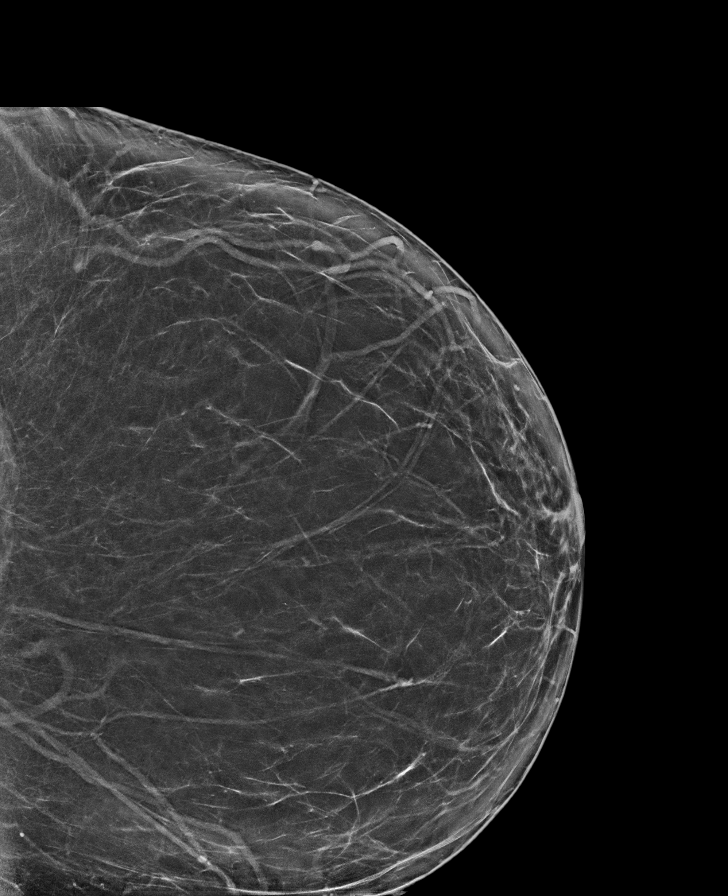

[L MLO synth-2D (1 of 2)]
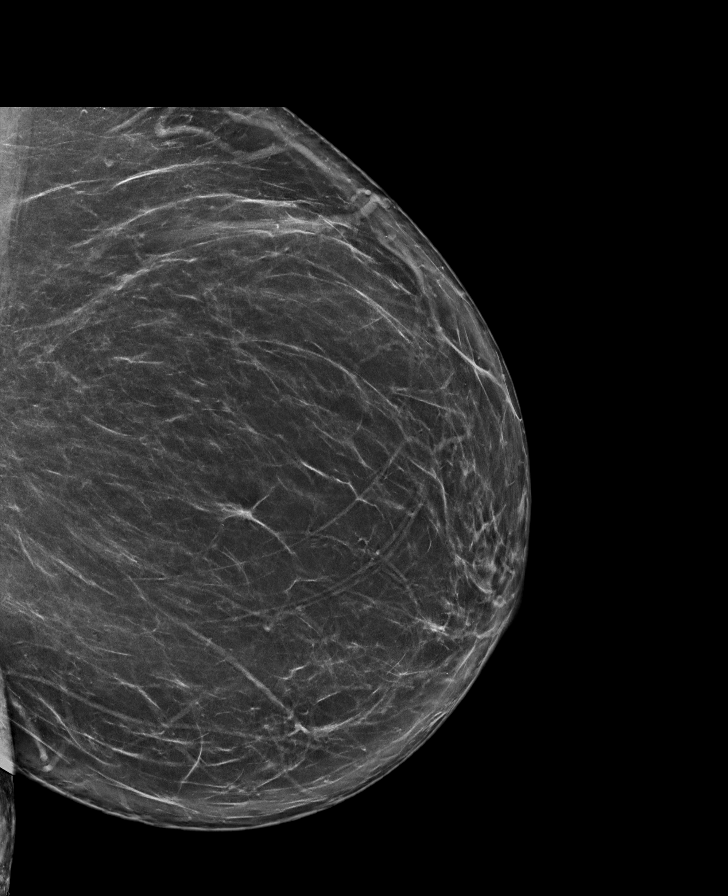

[R MLO synth-2D (1 of 2)]
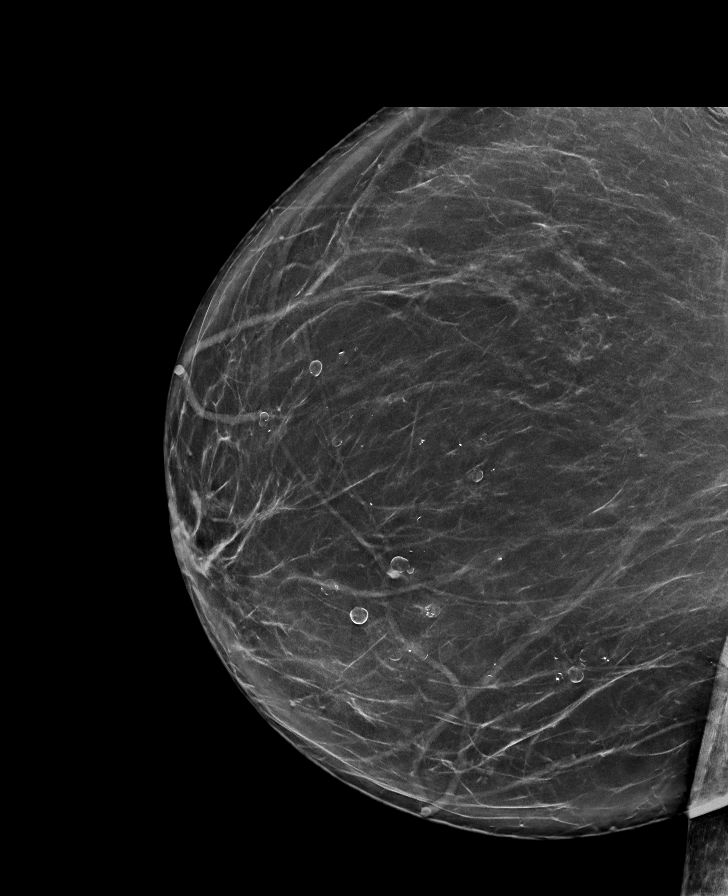

[R MLO synth-2D (2 of 2)]
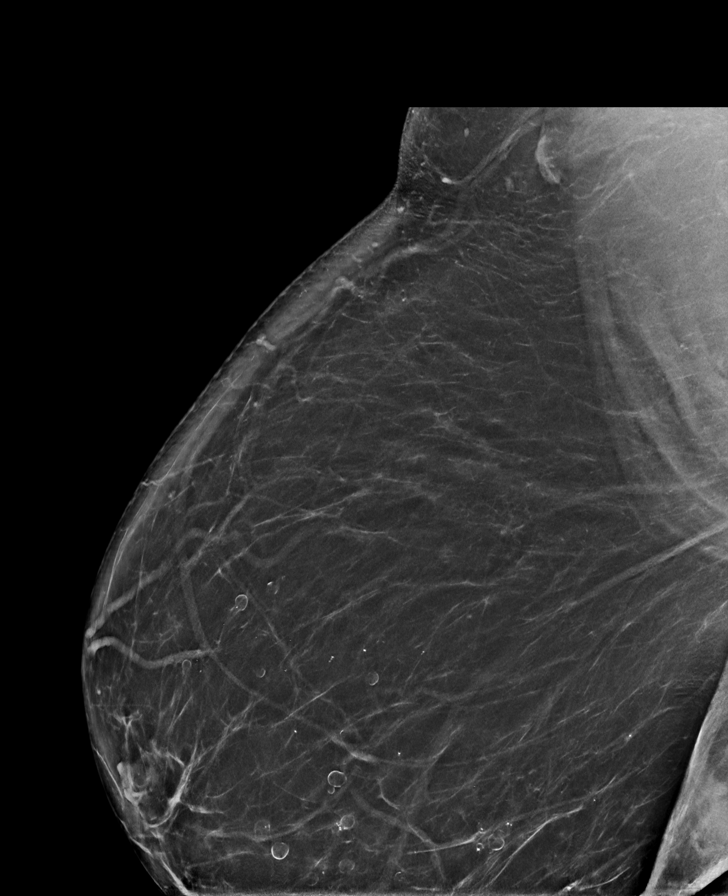

[R CV synth-2D]
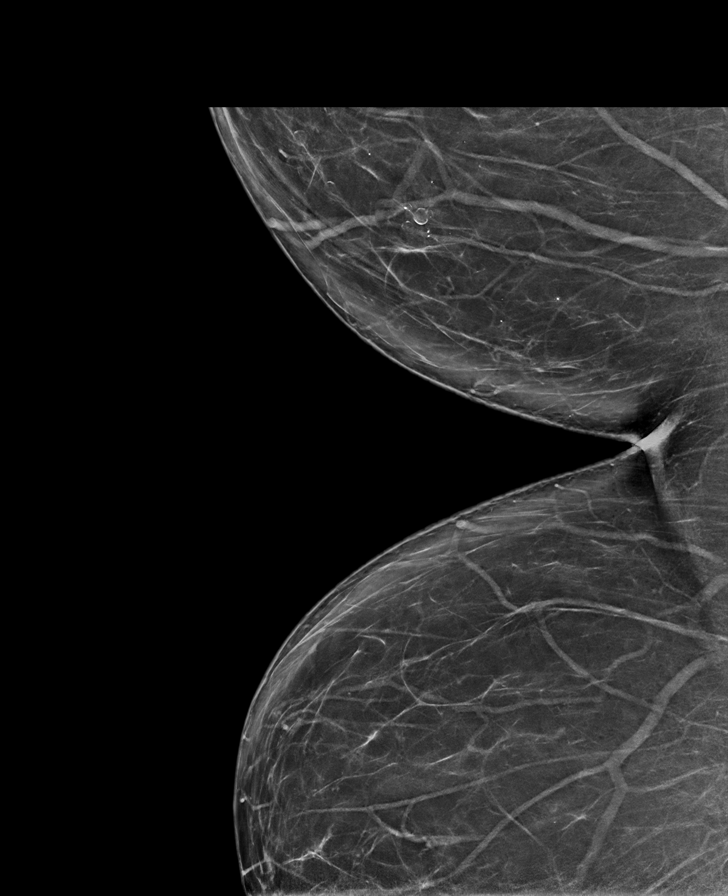

[R CC synth-2D]
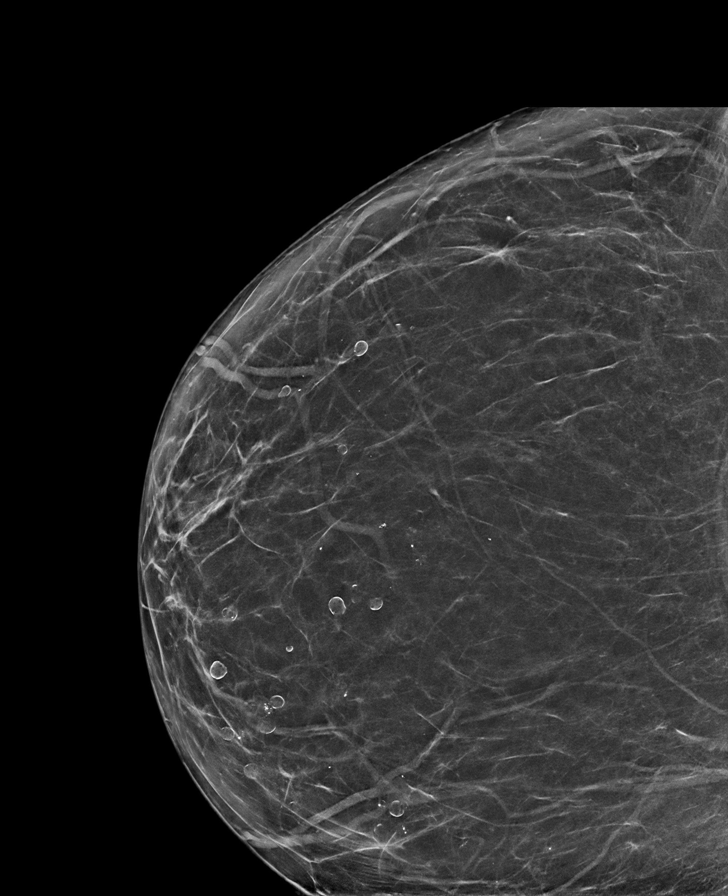

[L MLO synth-2D (2 of 2)]
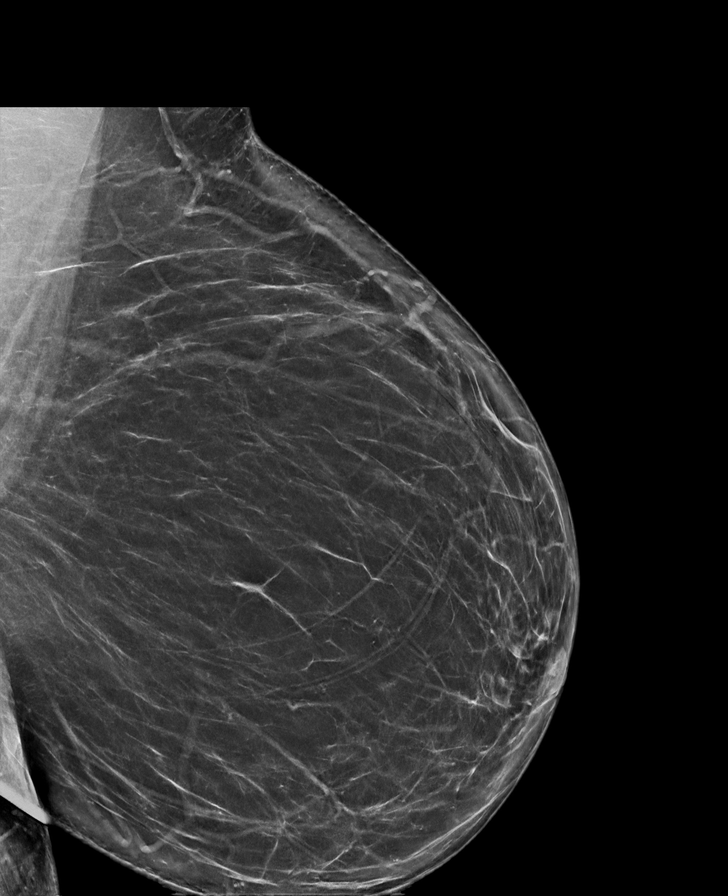

[R CV tomo · tomo slice 45/89.0]
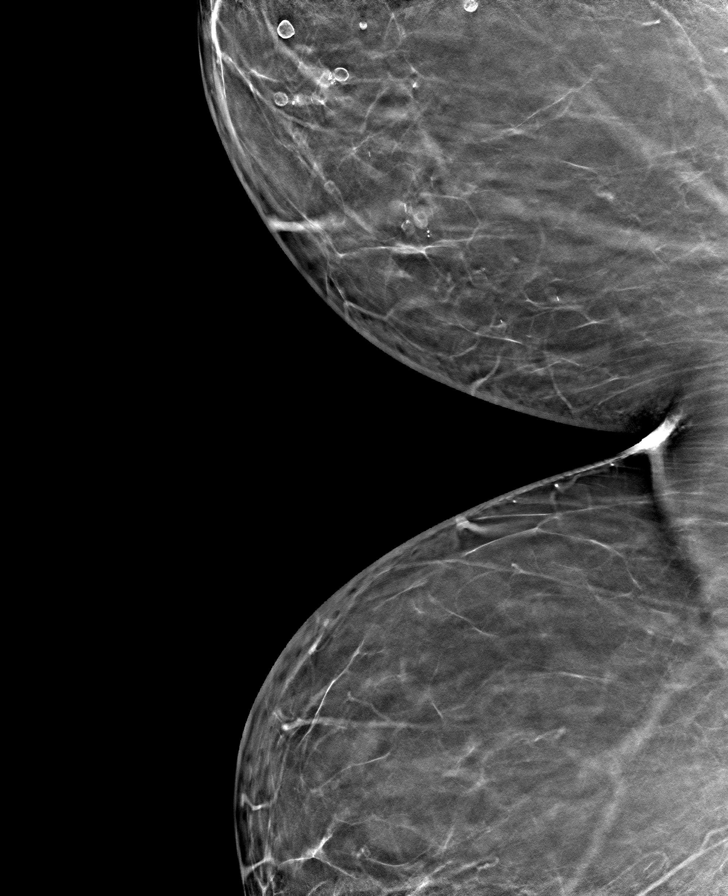

[8 of 40 positions shown; findings below may reference images not displayed]

FINDINGS: There are no findings suspicious for malignancy. The images were
evaluated with computer-aided detection.
IMPRESSION: No mammographic evidence of malignancy. A result letter of this
screening mammogram will be mailed directly to the patient.

RECOMMENDATION:
Screening mammogram in one year. (Code:[id number]-[id number])

BI-RADS CATEGORY  1: Negative.

## 2023-02-23 ENCOUNTER — Other Ambulatory Visit: Payer: Self-pay | Admitting: Family Medicine

## 2023-02-23 DIAGNOSIS — F418 Other specified anxiety disorders: Secondary | ICD-10-CM

## 2023-04-14 ENCOUNTER — Other Ambulatory Visit: Payer: Self-pay | Admitting: Family Medicine

## 2023-04-14 ENCOUNTER — Telehealth: Payer: Self-pay | Admitting: Family Medicine

## 2023-04-14 DIAGNOSIS — Z1231 Encounter for screening mammogram for malignant neoplasm of breast: Secondary | ICD-10-CM

## 2023-04-14 NOTE — Telephone Encounter (Signed)
Leah from [facility] called and said that they need the referral for the pt faxed over @ [phone] or you can give leah a call @ [phone]

## 2023-04-14 NOTE — Telephone Encounter (Signed)
I faxed it.Marland Kitchen and called but no answer. just in case she calls back

## 2023-04-30 ENCOUNTER — Ambulatory Visit
Admission: RE | Admit: 2023-04-30 | Discharge: 2023-04-30 | Disposition: A | Discharge status: Home / Self Care | Payer: Commercial Managed Care - PPO | Source: Ambulatory Visit | Attending: Family Medicine | Admitting: Family Medicine

## 2023-04-30 DIAGNOSIS — Z1231 Encounter for screening mammogram for malignant neoplasm of breast: Secondary | ICD-10-CM

## 2023-05-04 ENCOUNTER — Telehealth: Payer: Self-pay | Admitting: Family Medicine

## 2023-05-04 NOTE — Telephone Encounter (Signed)
Prescription Request  05/04/2023  LOV: 12/10/2022  What is the name of the medication or equipment? lisinopril-hydrochlorothiazide (ZESTORETIC) 20-25 MG tablet [[id number]], citalopram (CELEXA) 20 MG tablet [[id number]] and metFORMIN (GLUCOPHAGE) 500 MG tablet [[id number]]   Have you contacted your pharmacy to request a refill? Yes   Which pharmacy would you like this sent to?  [facility] - [place], [place] - [address] [place] [address] Phone: [phone] Fax: [phone]      Patient notified that their request is being sent to the clinical staff for review and that they should receive a response within 2 business days.   Please advise at [facility] [phone]

## 2023-05-05 ENCOUNTER — Other Ambulatory Visit: Payer: Self-pay

## 2023-05-05 DIAGNOSIS — Z794 Long term (current) use of insulin: Secondary | ICD-10-CM

## 2023-05-05 DIAGNOSIS — I10 Essential (primary) hypertension: Secondary | ICD-10-CM

## 2023-05-05 MED ORDER — LISINOPRIL-HYDROCHLOROTHIAZIDE 20-25 MG PO TABS
1.0000 | ORAL_TABLET | Freq: Every day | ORAL | 0 refills | Status: DC
Start: 2023-05-05 — End: 2023-08-06

## 2023-05-20 NOTE — Progress Notes (Shared)
[facility] - Clinic Note  05/24/2023     CHIEF COMPLAINT Patient presents for Retina Evaluation   HISTORY OF PRESENT ILLNESS: Jillian Fowler is a 67 y.o. female who presents to the clinic today for:   HPI     Retina Evaluation   In both eyes.  This started 2 months ago.  Duration of 2 months.  Associated Symptoms Floaters.  Context:  near vision.  I, the attending physician,  performed the HPI with the patient and updated documentation appropriately.        Comments   Retina eval per Dr Farris Has for DR pt is reporting a lot of floaters in both eyes she denies any flashes of light she also has some trouble with near vision she is currently not on any eye drops at this time last reading 121 last week last A1C 6.2      Last edited by Rennis Chris, MD on 05/24/2023  8:31 AM.    Pt is here on the referral of Dr. Doreene Burke for DM exam, pt states she hasn't seen an eye dr in about a year a half when she had cataract sx with Dr. Genia Del at [facility], she went to another dr and got glasses afterwards, but she is unsure of the dr, she is only on metformin, her last A1c was 6.7 in February  Referring physician: Mliss Sax, MD [address] [place],  [place] [address]  HISTORICAL INFORMATION:   Selected notes from the MEDICAL RECORD NUMBER Referred by Dr. Doreene Burke for diabetic eye exam LEE:  Ocular Hx- PMH-    CURRENT MEDICATIONS: No current outpatient medications on file. (Ophthalmic Drugs)   No current facility-administered medications for this visit. (Ophthalmic Drugs)   Current Outpatient Medications (Other)  Medication Sig   citalopram (CELEXA) 20 MG tablet TAKE 1 TABLET BY MOUTH DAILY   ferrous sulfate 325 (65 FE) MG tablet Take 1 tablet (325 mg total) by mouth daily.   insulin glargine, 1 Unit Dial, (TOUJEO SOLOSTAR) 300 UNIT/ML Solostar Pen INJECT 65 UNITS SUBCUTANEOUSLY AT BEDTIME (Patient not taking: Reported on 12/10/2022)    lisinopril-hydrochlorothiazide (ZESTORETIC) 20-25 MG tablet Take 1 tablet by mouth daily.   metFORMIN (GLUCOPHAGE) 500 MG tablet Take 2 tablets (1,000 mg total) by mouth 2 (two) times daily with a meal.   MOUNJARO 5 MG/0.5ML Pen Inject 5 mg into the skin once a week. (Patient not taking: Reported on 12/10/2022)   Multiple Vitamin (MULTI-VITAMIN) tablet Take 1 tablet by mouth daily.   omeprazole (PRILOSEC) 20 MG capsule Take 20 mg by mouth daily. (Patient not taking: Reported on 12/10/2022)   pravastatin (PRAVACHOL) 20 MG tablet Take 1 tablet (20 mg total) by mouth at bedtime.   No current facility-administered medications for this visit. (Other)   REVIEW OF SYSTEMS: ROS   Positive for: Endocrine, Cardiovascular, Eyes Last edited by Etheleen Mayhew, COT on 05/24/2023  8:14 AM.     ALLERGIES Allergies  Allergen Reactions   Atorvastatin     cramps   Simvastatin     Cramps    PAST MEDICAL HISTORY Past Medical History:  Diagnosis Date   Anxiety    Depression    Hyperlipidemia    Hypertension    Past Surgical History:  Procedure Laterality Date   BARIATRIC SURGERY  04/06/2022   EYE SURGERY  12/2020   UTERINE FIBROID SURGERY     FAMILY HISTORY Family History  Problem Relation Age of Onset   Hypertension  Mother    Diabetes Mother    Arthritis Mother    Hypertension Father    Diabetes Father    SOCIAL HISTORY Social History   Tobacco Use   Smoking status: Never   Smokeless tobacco: Never  Vaping Use   Vaping status: Never Used  Substance Use Topics   Alcohol use: Never   Drug use: Never       OPHTHALMIC EXAM:  Base Eye Exam     Visual Acuity (Snellen - Linear)       Right Left   Dist cc 20/20 -3 20/25 -3   Dist ph cc  20/30 -3         Tonometry (Tonopen, 8:24 AM)       Right Left   Pressure 20 21  Squeezing         Pupils       Pupils Dark Light Shape React APD   Right PERRL 4 3 Round Brisk None   Left PERRL 4 3 Round Brisk None          Visual Fields       Left Right    Full Full         Extraocular Movement       Right Left    Full, Ortho Full, Ortho         Neuro/Psych     Oriented x3: Yes   Mood/Affect: Normal         Dilation     Both eyes:            Slit Lamp and Fundus Exam     Slit Lamp Exam       Right Left   Lids/Lashes Dermatochalasis - upper lid Dermatochalasis - upper lid   Conjunctiva/Sclera mild melanosis mild melanosis   Cornea well healed cataract wound arcus, well healed cataract wound, mild tear film debris   Anterior Chamber deep and clear deep and clear   Iris Round and dilated, No NVI Round and dilated, No NVI   Lens PC IOL in good position PC IOL in good position   Anterior Vitreous mild syneresis, Posterior vitreous detachment mild syneresis, Posterior vitreous detachment, vitreous condensations         Fundus Exam       Right Left   Disc mild Pallor, Sharp rim, PPA/PPP Pink and Sharp, +PPP   C/D Ratio 0.4 0.5   Macula Flat, Good foveal reflex, RPE mottling, No heme or edema Flat, Good foveal reflex, RPE mottling, No heme or edema   Vessels attenuated, mild tortuosity, mild copper wiring mild attenuation, mild tortuosity, mild copper wiring, mild AV crossing changes   Periphery Attached, No heme Attached, No heme           Refraction     Wearing Rx       Sphere Cylinder Axis Add   Right -0.25 +0.50 166 +2.50   Left -0.75 +0.75 087 +2.50           IMAGING AND PROCEDURES  Imaging and Procedures for 05/24/2023  OCT, Retina - OU - Both Eyes       Right Eye Quality was good. Central Foveal Thickness: 258. Progression has no prior data. Findings include normal foveal contour, no IRF, no SRF (Focal ellipsoid disruption nasal macula).   Left Eye Quality was good. Central Foveal Thickness: 261. Progression has no prior data. Findings include normal foveal contour, no IRF, no SRF (No DME ).   Notes *Images captured  and stored on  drive  Diagnosis / Impression:  NFP, no IRF/SRF OU No DME OU  Clinical management:  See below  Abbreviations: NFP - Normal foveal profile. CME - cystoid macular edema. PED - pigment epithelial detachment. IRF - intraretinal fluid. SRF - subretinal fluid. EZ - ellipsoid zone. ERM - epiretinal membrane. ORA - outer retinal atrophy. ORT - outer retinal tubulation. SRHM - subretinal hyper-reflective material. IRHM - intraretinal hyper-reflective material            ASSESSMENT/PLAN:    ICD-10-CM   1. Diabetes mellitus type 2 without retinopathy (HCC)  E11.9 OCT, Retina - OU - Both Eyes    2. Long term (current) use of oral hypoglycemic drugs  Z79.84     3. Essential hypertension  I10     4. Hypertensive retinopathy of both eyes  H35.033      1. Diabetes mellitus, type 2 without retinopathy  - A1c: 6.7 on 02.08.24 - The incidence, risk factors for progression, natural history and treatment options for diabetic retinopathy  were discussed with patient.   - The need for close monitoring of blood glucose, blood pressure, and serum lipids, avoiding cigarette or any type of tobacco, and the need for long term follow up was also discussed with patient. - f/u in 1 year, sooner prn  2,3. Hypertensive retinopathy OU - discussed importance of tight BP control - monitor  4. Pseudophakia OU  - s/p CE/IOL OU (Dr. Genia Del, 2022)  - IOLs in good position, doing well  - monitor  Ophthalmic Meds Ordered this visit:  No orders of the defined types were placed in this encounter.    Return in about 1 year (around 05/23/2024) for f/u DM exam, DFE, OCT.  There are no Patient Instructions on file for this visit.   Explained the diagnoses, plan, and follow up with the patient and they expressed understanding.  Patient expressed understanding of the importance of proper follow up care.   This document serves as a record of services personally performed by Karie Chimera, MD, PhD. It was  created on their behalf by Annalee Genta, COMT. The creation of this record is the provider's dictation and/or activities during the visit.  Electronically signed by: Annalee Genta, COMT 05/24/23 12:18 PM  This document serves as a record of services personally performed by Karie Chimera, MD, PhD. It was created on their behalf by Glee Arvin. Manson Passey, OA an ophthalmic technician. The creation of this record is the provider's dictation and/or activities during the visit.    Electronically signed by: Glee Arvin. Manson Passey, OA 05/24/23 12:18 PM  Karie Chimera, M.D., Ph.D. Diseases & Surgery of the Retina and Vitreous [facility] 07.22.2024  I have reviewed the above documentation for accuracy and completeness, and I agree with the above. Karie Chimera, M.D., Ph.D. 05/24/23 12:18 PM   Abbreviations: M myopia (nearsighted); A astigmatism; H hyperopia (farsighted); P presbyopia; Mrx spectacle prescription;  CTL contact lenses; OD right eye; OS left eye; OU both eyes  XT exotropia; ET esotropia; PEK punctate epithelial keratitis; PEE punctate epithelial erosions; DES dry eye syndrome; MGD meibomian gland dysfunction; ATs artificial tears; PFAT's preservative free artificial tears; NSC nuclear sclerotic cataract; PSC posterior subcapsular cataract; ERM epi-retinal membrane; PVD posterior vitreous detachment; RD retinal detachment; DM diabetes mellitus; DR diabetic retinopathy; NPDR non-proliferative diabetic retinopathy; PDR proliferative diabetic retinopathy; CSME clinically significant macular edema; DME diabetic macular edema; dbh dot blot hemorrhages; CWS cotton wool  spot; POAG primary open angle glaucoma; C/D cup-to-disc ratio; HVF humphrey visual field; GVF goldmann visual field; OCT optical coherence tomography; IOP intraocular pressure; BRVO Branch retinal vein occlusion; CRVO central retinal vein occlusion; CRAO central retinal artery occlusion; BRAO branch retinal artery  occlusion; RT retinal tear; SB scleral buckle; PPV pars plana vitrectomy; VH Vitreous hemorrhage; PRP panretinal laser photocoagulation; IVK intravitreal kenalog; VMT vitreomacular traction; MH Macular hole;  NVD neovascularization of the disc; NVE neovascularization elsewhere; AREDS age related eye disease study; ARMD age related macular degeneration; POAG primary open angle glaucoma; EBMD epithelial/anterior basement membrane dystrophy; ACIOL anterior chamber intraocular lens; IOL intraocular lens; PCIOL posterior chamber intraocular lens; Phaco/IOL phacoemulsification with intraocular lens placement; PRK photorefractive keratectomy; LASIK laser assisted in situ keratomileusis; HTN hypertension; DM diabetes mellitus; COPD chronic obstructive pulmonary disease

## 2023-05-24 ENCOUNTER — Encounter (INDEPENDENT_AMBULATORY_CARE_PROVIDER_SITE_OTHER): Payer: Self-pay | Admitting: Ophthalmology

## 2023-05-24 ENCOUNTER — Ambulatory Visit (INDEPENDENT_AMBULATORY_CARE_PROVIDER_SITE_OTHER): Payer: Commercial Managed Care - PPO | Admitting: Ophthalmology

## 2023-05-24 DIAGNOSIS — E119 Type 2 diabetes mellitus without complications: Secondary | ICD-10-CM | POA: Diagnosis not present

## 2023-05-24 DIAGNOSIS — Z7984 Long term (current) use of oral hypoglycemic drugs: Secondary | ICD-10-CM

## 2023-05-24 DIAGNOSIS — H35033 Hypertensive retinopathy, bilateral: Secondary | ICD-10-CM

## 2023-05-24 DIAGNOSIS — I10 Essential (primary) hypertension: Secondary | ICD-10-CM | POA: Diagnosis not present

## 2023-05-26 ENCOUNTER — Other Ambulatory Visit: Payer: Self-pay | Admitting: Family Medicine

## 2023-05-26 DIAGNOSIS — F418 Other specified anxiety disorders: Secondary | ICD-10-CM

## 2023-08-06 ENCOUNTER — Other Ambulatory Visit: Payer: Self-pay | Admitting: Family Medicine

## 2023-08-06 ENCOUNTER — Telehealth: Payer: Self-pay | Admitting: Family Medicine

## 2023-08-06 ENCOUNTER — Other Ambulatory Visit: Payer: Self-pay

## 2023-08-06 DIAGNOSIS — Z794 Long term (current) use of insulin: Secondary | ICD-10-CM

## 2023-08-06 DIAGNOSIS — I10 Essential (primary) hypertension: Secondary | ICD-10-CM

## 2023-08-06 MED ORDER — LISINOPRIL-HYDROCHLOROTHIAZIDE 20-25 MG PO TABS
1.0000 | ORAL_TABLET | Freq: Every day | ORAL | 0 refills | Status: DC
Start: 2023-08-06 — End: 2023-08-30

## 2023-08-06 NOTE — Telephone Encounter (Signed)
Prescription Request  08/06/2023  LOV: 12/10/2022  What is the name of the medication or equipment? lisinopril-hydrochlorothiazide (ZESTORETIC) 20-25 MG tablet [[id number]]    Have you contacted your pharmacy to request a refill? Yes, she took her last pill yesterday 08/05/23  Which pharmacy would you like this sent to?  [facility] - [place], [place] - [address] [place] [address] Phone: [phone] Fax: [phone]      Patient notified that their request is being sent to the clinical staff for review and that they should receive a response within 2 business days.   Please advise at [facility] [phone]

## 2023-08-27 ENCOUNTER — Telehealth: Payer: Self-pay | Admitting: Family Medicine

## 2023-08-27 ENCOUNTER — Other Ambulatory Visit: Payer: Self-pay

## 2023-08-27 DIAGNOSIS — E1169 Type 2 diabetes mellitus with other specified complication: Secondary | ICD-10-CM

## 2023-08-27 DIAGNOSIS — F418 Other specified anxiety disorders: Secondary | ICD-10-CM

## 2023-08-27 MED ORDER — METFORMIN HCL 500 MG PO TABS
1000.0000 mg | ORAL_TABLET | Freq: Two times a day (BID) | ORAL | 0 refills | Status: DC
Start: 2023-08-27 — End: 2023-08-30

## 2023-08-27 MED ORDER — CITALOPRAM HYDROBROMIDE 20 MG PO TABS
20.0000 mg | ORAL_TABLET | Freq: Every day | ORAL | 0 refills | Status: DC
Start: 2023-08-27 — End: 2023-08-30

## 2023-08-27 NOTE — Telephone Encounter (Signed)
Tried calling pt to sch an OV for her med refill but mailbox is full.

## 2023-08-27 NOTE — Telephone Encounter (Signed)
Appt made and is asking for a refill until Monday

## 2023-08-27 NOTE — Telephone Encounter (Signed)
Prescription Request  08/27/2023  LOV: 12/10/2022  What is the name of the medication or equipment?   1) metFORMIN (GLUCOPHAGE) 500 MG tablet [[id number]]  2) citalopram (CELEXA) 20 MG tablet [[id number]]  3) lisinopril-hydrochlorothiazide (ZESTORETIC) 20-25 MG tablet [[id number]]   Have you contacted your pharmacy to request a refill? No   Which pharmacy would you like this sent to?     [facility] - [place], [place] - [address] [place] [address] Phone: [phone] Fax: [phone]   Patient notified that their request is being sent to the clinical staff for review and that they should receive a response within 2 business days.   Please advise at Mobile [phone] (mobile)

## 2023-08-30 ENCOUNTER — Encounter: Payer: Self-pay | Admitting: Family Medicine

## 2023-08-30 ENCOUNTER — Ambulatory Visit (INDEPENDENT_AMBULATORY_CARE_PROVIDER_SITE_OTHER): Payer: Commercial Managed Care - PPO | Admitting: Family Medicine

## 2023-08-30 VITALS — BP 122/70 | HR 86 | Temp 97.9°F | Ht 62.0 in | Wt 188.6 lb

## 2023-08-30 DIAGNOSIS — E1169 Type 2 diabetes mellitus with other specified complication: Secondary | ICD-10-CM | POA: Diagnosis not present

## 2023-08-30 DIAGNOSIS — I10 Essential (primary) hypertension: Secondary | ICD-10-CM

## 2023-08-30 DIAGNOSIS — F418 Other specified anxiety disorders: Secondary | ICD-10-CM | POA: Diagnosis not present

## 2023-08-30 DIAGNOSIS — Z6834 Body mass index (BMI) 34.0-34.9, adult: Secondary | ICD-10-CM

## 2023-08-30 DIAGNOSIS — E7800 Pure hypercholesterolemia, unspecified: Secondary | ICD-10-CM | POA: Diagnosis not present

## 2023-08-30 DIAGNOSIS — Z794 Long term (current) use of insulin: Secondary | ICD-10-CM

## 2023-08-30 DIAGNOSIS — Z0000 Encounter for general adult medical examination without abnormal findings: Secondary | ICD-10-CM

## 2023-08-30 DIAGNOSIS — E611 Iron deficiency: Secondary | ICD-10-CM

## 2023-08-30 LAB — URINALYSIS, ROUTINE W REFLEX MICROSCOPIC
Bilirubin Urine: NEGATIVE
Hgb urine dipstick: NEGATIVE
Ketones, ur: NEGATIVE
Nitrite: NEGATIVE
RBC / HPF: NONE SEEN (ref 0–?)
Specific Gravity, Urine: 1.025 (ref 1.000–1.030)
Total Protein, Urine: NEGATIVE
Urine Glucose: NEGATIVE
Urobilinogen, UA: 0.2 (ref 0.0–1.0)
pH: 6 (ref 5.0–8.0)

## 2023-08-30 LAB — HEMOGLOBIN A1C: Hgb A1c MFr Bld: 7.9 % — ABNORMAL HIGH (ref 4.6–6.5)

## 2023-08-30 LAB — COMPREHENSIVE METABOLIC PANEL
ALT: 8 U/L (ref 0–35)
AST: 10 U/L (ref 0–37)
Albumin: 4 g/dL (ref 3.5–5.2)
Alkaline Phosphatase: 47 U/L (ref 39–117)
BUN: 18 mg/dL (ref 6–23)
CO2: 32 meq/L (ref 19–32)
Calcium: 9.3 mg/dL (ref 8.4–10.5)
Chloride: 100 meq/L (ref 96–112)
Creatinine, Ser: 0.8 mg/dL (ref 0.40–1.20)
GFR: 77.08 mL/min (ref >60.00)
Glucose, Bld: 121 mg/dL — ABNORMAL HIGH (ref 70–99)
Potassium: 4.1 meq/L (ref 3.5–5.1)
Sodium: 138 meq/L (ref 135–145)
Total Bilirubin: 0.5 mg/dL (ref 0.2–1.2)
Total Protein: 6.2 g/dL (ref 6.0–8.3)

## 2023-08-30 LAB — LIPID PANEL
Cholesterol: 240 mg/dL — ABNORMAL HIGH (ref 0–200)
HDL: 52.6 mg/dL (ref >39.00)
LDL Cholesterol: 171 mg/dL — ABNORMAL HIGH (ref 0–99)
NonHDL: 187.64
Total CHOL/HDL Ratio: 5
Triglycerides: 81 mg/dL (ref 0.0–149.0)
VLDL: 16.2 mg/dL (ref 0.0–40.0)

## 2023-08-30 LAB — CBC
HCT: 37.4 % (ref 36.0–46.0)
Hemoglobin: 11.5 g/dL — ABNORMAL LOW (ref 12.0–15.0)
MCHC: 30.8 g/dL (ref 30.0–36.0)
MCV: 73.4 fL — ABNORMAL LOW (ref 78.0–100.0)
Platelets: 240 10*3/uL (ref 150.0–400.0)
RBC: 5.09 Mil/uL (ref 3.87–5.11)
RDW: 14 % (ref 11.5–15.5)
WBC: 7 10*3/uL (ref 4.0–10.5)

## 2023-08-30 LAB — MICROALBUMIN / CREATININE URINE RATIO
Creatinine,U: 139.8 mg/dL
Microalb Creat Ratio: 0.5 mg/g (ref 0.0–30.0)
Microalb, Ur: <0.7 mg/dL (ref 0.0–1.9)

## 2023-08-30 MED ORDER — MOUNJARO 5 MG/0.5ML ~~LOC~~ SOAJ: 5.0000 mg | SUBCUTANEOUS | 3 refills | Status: DC

## 2023-08-30 MED ORDER — LISINOPRIL-HYDROCHLOROTHIAZIDE 20-25 MG PO TABS
1.0000 | ORAL_TABLET | Freq: Every day | ORAL | 3 refills | Status: DC
Start: 2023-08-30 — End: 2024-09-21

## 2023-08-30 MED ORDER — METFORMIN HCL 500 MG PO TABS
1000.0000 mg | ORAL_TABLET | Freq: Two times a day (BID) | ORAL | 3 refills | Status: DC
Start: 2023-08-30 — End: 2024-09-14

## 2023-08-30 MED ORDER — CITALOPRAM HYDROBROMIDE 20 MG PO TABS: 20.0000 mg | ORAL_TABLET | Freq: Every day | ORAL | 3 refills | Status: DC

## 2023-08-30 MED ORDER — ROSUVASTATIN CALCIUM 5 MG PO TABS
5.0000 mg | ORAL_TABLET | Freq: Every day | ORAL | 1 refills | Status: DC
Start: 2023-08-30 — End: 2023-10-29

## 2023-08-30 NOTE — Progress Notes (Signed)
Established Patient Office Visit   Subjective:  Patient ID: Jillian Fowler, female    DOB: Jul 06, 1956  Age: 67 y.o. MRN: [id number]  Chief Complaint  Patient presents with   Medical Management of Chronic Issues    Follow up. Pt is fasting. Pt had a fall and hit her head on the cement.     HPI Encounter Diagnoses  Name Primary?   Healthcare maintenance Yes   Essential hypertension    Type 2 diabetes mellitus with other specified complication, with long-term current use of insulin (HCC)    Depression with anxiety    Adult BMI 34.0-34.9 kg/sq m    Elevated LDL cholesterol level    Iron deficiency    Here for physical and follow-up of above.  Doing okay.  Continues to manage nursing facility.  She is active by exercising most days of the week.  She is having regular dental care.  Last Pap was a year ago per patient.   Review of Systems  Constitutional: Negative.   HENT: Negative.    Eyes:  Negative for blurred vision, discharge and redness.  Respiratory: Negative.    Cardiovascular: Negative.   Gastrointestinal:  Negative for abdominal pain.  Genitourinary: Negative.   Musculoskeletal: Negative.  Negative for myalgias.  Skin:  Negative for rash.  Neurological:  Negative for tingling, loss of consciousness and weakness.  Endo/Heme/Allergies:  Negative for polydipsia.     Current Outpatient Medications:    ferrous sulfate 325 (65 FE) MG tablet, Take 1 tablet (325 mg total) by mouth daily., Disp: 90 tablet, Rfl: 1   Multiple Vitamin (MULTI-VITAMIN) tablet, Take 1 tablet by mouth daily., Disp: 90 tablet, Rfl: 3   omeprazole (PRILOSEC) 20 MG capsule, Take 20 mg by mouth daily., Disp: , Rfl:    pravastatin (PRAVACHOL) 20 MG tablet, Take 1 tablet (20 mg total) by mouth at bedtime., Disp: 90 tablet, Rfl: 1   rosuvastatin (CRESTOR) 5 MG tablet, Take 1 tablet (5 mg total) by mouth daily., Disp: 90 tablet, Rfl: 1   citalopram (CELEXA) 20 MG tablet, Take 1 tablet (20 mg total) by  mouth daily., Disp: 90 tablet, Rfl: 3   insulin glargine, 1 Unit Dial, (TOUJEO SOLOSTAR) 300 UNIT/ML Solostar Pen, INJECT 65 UNITS SUBCUTANEOUSLY AT BEDTIME (Patient not taking: Reported on 08/30/2023), Disp: 6 mL, Rfl: 0   lisinopril-hydrochlorothiazide (ZESTORETIC) 20-25 MG tablet, Take 1 tablet by mouth daily., Disp: 90 tablet, Rfl: 3   metFORMIN (GLUCOPHAGE) 500 MG tablet, Take 2 tablets (1,000 mg total) by mouth 2 (two) times daily with a meal., Disp: 360 tablet, Rfl: 3   MOUNJARO 5 MG/0.5ML Pen, Inject 5 mg into the skin once a week., Disp: 4 mL, Rfl: 3   Objective:     BP 122/70   Pulse 86   Temp 97.9 F (36.6 C)   Ht 5\' 2"  (1.575 m)   Wt 188 lb 9.6 oz (85.5 kg)   SpO2 95%   BMI 34.50 kg/m    Physical Exam Constitutional:      General: She is not in acute distress.    Appearance: Normal appearance. She is not ill-appearing, toxic-appearing or diaphoretic.  HENT:     Head: Normocephalic and atraumatic.     Right Ear: External ear normal.     Left Ear: External ear normal.     Mouth/Throat:     Mouth: Mucous membranes are moist.     Pharynx: Oropharynx is clear. No oropharyngeal exudate or posterior oropharyngeal erythema.  Eyes:     General: No scleral icterus.       Right eye: No discharge.        Left eye: No discharge.     Extraocular Movements: Extraocular movements intact.     Conjunctiva/sclera: Conjunctivae normal.     Pupils: Pupils are equal, round, and reactive to light.  Cardiovascular:     Rate and Rhythm: Normal rate and regular rhythm.     Pulses:          Dorsalis pedis pulses are 2+ on the right side and 2+ on the left side.       Posterior tibial pulses are 2+ on the right side and 2+ on the left side.  Pulmonary:     Effort: Pulmonary effort is normal. No respiratory distress.     Breath sounds: Normal breath sounds.  Abdominal:     General: Bowel sounds are normal.  Musculoskeletal:     Cervical back: No rigidity or tenderness.  Skin:     General: Skin is warm and dry.  Neurological:     Mental Status: She is alert and oriented to person, place, and time.  Psychiatric:        Mood and Affect: Mood normal.        Behavior: Behavior normal.   Pends pends .diabet  No results found for any visits on 08/30/23.    The 10-year ASCVD risk score (Arnett DK, et al., 2019) is: 14.4%    Assessment & Plan:   Healthcare maintenance  Essential hypertension -     Lisinopril-hydroCHLOROthiazide; Take 1 tablet by mouth daily.  Dispense: 90 tablet; Refill: 3 -     CBC -     Comprehensive metabolic panel -     Urinalysis, Routine w reflex microscopic -     Microalbumin / creatinine urine ratio  Type 2 diabetes mellitus with other specified complication, with long-term current use of insulin (HCC) -     Lisinopril-hydroCHLOROthiazide; Take 1 tablet by mouth daily.  Dispense: 90 tablet; Refill: 3 -     metFORMIN HCl; Take 2 tablets (1,000 mg total) by mouth 2 (two) times daily with a meal.  Dispense: 360 tablet; Refill: 3 -     Comprehensive metabolic panel -     Urinalysis, Routine w reflex microscopic -     Microalbumin / creatinine urine ratio -     Hemoglobin A1c -     Mounjaro; Inject 5 mg into the skin once a week.  Dispense: 4 mL; Refill: 3  Depression with anxiety -     Citalopram Hydrobromide; Take 1 tablet (20 mg total) by mouth daily.  Dispense: 90 tablet; Refill: 3  Adult BMI 34.0-34.9 kg/sq m  Elevated LDL cholesterol level -     Lipid panel -     Rosuvastatin Calcium; Take 1 tablet (5 mg total) by mouth daily.  Dispense: 90 tablet; Refill: 1  Iron deficiency -     Iron, TIBC and Ferritin Panel    Return in about 6 months (around 02/28/2024).  Reported cramps with atorvastatin and simvastatin.  Willing to try rosuvastatin.  Will start at low-dose 5 mg daily.  Information was given on health maintenance and disease prevention.  Continue exercising.  Restart Mounjaro for help with weight loss.  Mliss Sax, MD

## 2023-08-31 LAB — IRON,TIBC AND FERRITIN PANEL
%SAT: 25 % (ref 16–45)
Ferritin: 58 ng/mL (ref 16–288)
Iron: 64 ug/dL (ref 45–160)
TIBC: 254 ug/dL (ref 250–450)

## 2023-08-31 NOTE — Addendum Note (Signed)
Addended by: Andrez Grime on: 08/31/2023 08:05 AM   Modules accepted: Orders

## 2023-10-14 NOTE — Progress Notes (Unsigned)
[facility]IMARY [facility]ARE [facility] [address] [place] [address] Dept: [phone] Dept Fax: [phone]  Acute Care Office Visit  Subjective:   Jillian Fowler Oct 11, 1956 10/15/2023  Chief Complaint  Patient presents with   Hemorrhoids    Noticed a week and half ago    HPI: Discussed the use of AI scribe software for clinical note transcription with the patient, who gave verbal consent to proceed.  History of Present Illness   The patient presents with a week and a half history of an uncomfortable hemorrhoid that has been resistant to over-the-counter treatments. He describes the area as being moist and notes the presence of bright red blood upon wiping. The patient denies constipation and reports loose stools. He has a history of hemorrhoids, but it has been many years since his last episode. The patient underwent bariatric surgery about a year and a half ago and has gained a small amount of weight recently. He also experienced a few hours of abdominal cramping, which has since resolved.         The following portions of the patient's history were reviewed and updated as appropriate: past medical history, past surgical history, family history, social history, allergies, medications, and problem list.   Patient Active Problem List   Diagnosis Date Noted   Thrombosed external hemorrhoid 10/15/2023   Adult BMI 34.0-34.9 kg/sq m 08/30/2023   Lightheadedness 06/03/2022   Microcytic anemia 05/07/2022   Class 3 severe obesity due to excess calories with serious comorbidity and body mass index (BMI) of 40.0 to 44.9 in adult (HCC) 11/24/2021   Hyperlipidemia 09/04/2021   Elevated LDL cholesterol level 06/11/2021   Healthcare maintenance 06/11/2021   Essential hypertension 04/19/2020   Diabetes mellitus (HCC) 04/19/2020   Morbid obesity (HCC) 04/19/2020   Heart murmur 04/19/2020   Thyroid nodule 04/19/2020   Depression with anxiety  04/19/2020   Primary insomnia 04/19/2020   Past Medical History:  Diagnosis Date   Anxiety    Depression    Hyperlipidemia    Hypertension    Past Surgical History:  Procedure Laterality Date   BARIATRIC SURGERY  04/06/2022   EYE SURGERY  12/2020   UTERINE FIBROID SURGERY     Family History  Problem Relation Age of Onset   Hypertension Mother    Diabetes Mother    Arthritis Mother    Hypertension Father    Diabetes Father     Current Outpatient Medications:    citalopram (CELEXA) 20 MG tablet, Take 1 tablet (20 mg total) by mouth daily., Disp: 90 tablet, Rfl: 3   ferrous sulfate 325 (65 FE) MG tablet, Take 1 tablet (325 mg total) by mouth daily., Disp: 90 tablet, Rfl: 1   hydrocortisone (ANUSOL-HC) 2.5 % rectal cream, Place 1 Application rectally 2 (two) times daily., Disp: 30 g, Rfl: 0   lisinopril-hydrochlorothiazide (ZESTORETIC) 20-25 MG tablet, Take 1 tablet by mouth daily., Disp: 90 tablet, Rfl: 3   metFORMIN (GLUCOPHAGE) 500 MG tablet, Take 2 tablets (1,000 mg total) by mouth 2 (two) times daily with a meal., Disp: 360 tablet, Rfl: 3   Multiple Vitamin (MULTI-VITAMIN) tablet, Take 1 tablet by mouth daily., Disp: 90 tablet, Rfl: 3   rosuvastatin (CRESTOR) 5 MG tablet, Take 1 tablet (5 mg total) by mouth daily., Disp: 90 tablet, Rfl: 1   MOUNJARO 5 MG/0.5ML Pen, Inject 5 mg into the skin once a week. (Patient not taking: Reported on 10/15/2023), Disp: 4 mL, Rfl: 3   omeprazole (  PRILOSEC) 20 MG capsule, Take 20 mg by mouth daily. (Patient not taking: Reported on 10/15/2023), Disp: , Rfl:  Allergies  Allergen Reactions   Atorvastatin     cramps   Simvastatin     Cramps      ROS: A complete ROS was performed with pertinent positives/negatives noted in the HPI. The remainder of the ROS are negative.    Objective:   Today's Vitals   10/15/23 1010  BP: 110/60  Pulse: 61  Temp: 97.8 F (36.6 C)  TempSrc: Temporal  SpO2: 95%  Weight: 189 lb 9.6 oz (86 kg)   Height: 5\' 2"  (1.575 m)    GENERAL: Well-appearing, in NAD. Well nourished.  SKIN: Pink, warm and dry. No rash, lesion, ulceration, or ecchymoses.  NECK: Trachea midline. Full ROM w/o pain or tenderness. No lymphadenopathy.  RESPIRATORY: Chest wall symmetrical. Respirations even and non-labored. Breath sounds clear to auscultation bilaterally.  CARDIAC: S1, S2 present, regular rate and rhythm. Peripheral pulses 2+ bilaterally.  MSK: Muscle tone and strength appropriate for age. Joints w/o tenderness, redness, or swelling. EXTREMITIES: Without clubbing, cyanosis, or edema.  NEUROLOGIC: No motor or sensory deficits. Steady, even gait.  PSYCH/MENTAL STATUS: Alert, oriented x 3. Cooperative, appropriate mood and affect.    No results found for any visits on 10/15/23.    Assessment & Plan:  Assessment and Plan    Thrombosed External Hemorrhoid Present for 1.5 weeks with discomfort and bright red blood on wiping. No constipation or hard stools. Loose stools reported. -Apply Anusol cream as needed for symptom relief. -Refer to general surgery for incision and drainage of thrombosed hemorrhoid. -Schedule follow-up office visit as needed.          Thrombosed external hemorrhoid -     Hydrocortisone (Perianal); Place 1 Application rectally 2 (two) times daily.  Dispense: 30 g; Refill: 0 -     Ambulatory referral to General Surgery   Meds ordered this encounter  Medications   hydrocortisone (ANUSOL-HC) 2.5 % rectal cream    Sig: Place 1 Application rectally 2 (two) times daily.    Dispense:  30 g    Refill:  0    Supervising Provider:   Garnette Gunner [[id number]]   Orders Placed This Encounter  Procedures   Ambulatory referral to General Surgery    Referral Priority:   Urgent    Referral Type:   Surgical    Referral Reason:   Specialty Services Required    Referred to Provider:   Darnell Level, MD    Requested Specialty:   General Surgery    Number of Visits Requested:   1    Lab Orders  No laboratory test(s) ordered today   No images are attached to the encounter or orders placed in the encounter.  Return if symptoms worsen or fail to improve.   Salvatore Decent, FNP

## 2023-10-15 ENCOUNTER — Encounter: Payer: Self-pay | Admitting: Internal Medicine

## 2023-10-15 ENCOUNTER — Ambulatory Visit (INDEPENDENT_AMBULATORY_CARE_PROVIDER_SITE_OTHER): Payer: Commercial Managed Care - PPO | Admitting: Internal Medicine

## 2023-10-15 VITALS — BP 110/60 | HR 61 | Temp 97.8°F | Ht 62.0 in | Wt 189.6 lb

## 2023-10-15 DIAGNOSIS — K645 Perianal venous thrombosis: Secondary | ICD-10-CM | POA: Diagnosis not present

## 2023-10-15 MED ORDER — HYDROCORTISONE (PERIANAL) 2.5 % EX CREA: 1.0000 | TOPICAL_CREAM | Freq: Two times a day (BID) | CUTANEOUS | 0 refills | Status: DC

## 2023-10-29 ENCOUNTER — Other Ambulatory Visit: Payer: Self-pay | Admitting: Family Medicine

## 2023-10-29 DIAGNOSIS — E1169 Type 2 diabetes mellitus with other specified complication: Secondary | ICD-10-CM

## 2023-10-29 DIAGNOSIS — E7800 Pure hypercholesterolemia, unspecified: Secondary | ICD-10-CM

## 2023-12-27 ENCOUNTER — Other Ambulatory Visit (HOSPITAL_COMMUNITY): Payer: Self-pay

## 2023-12-28 ENCOUNTER — Other Ambulatory Visit: Payer: Self-pay | Admitting: Family Medicine

## 2023-12-28 DIAGNOSIS — E1169 Type 2 diabetes mellitus with other specified complication: Secondary | ICD-10-CM

## 2023-12-31 ENCOUNTER — Telehealth: Payer: Self-pay

## 2023-12-31 NOTE — Telephone Encounter (Signed)
 Copied from CRM [id number]. Topic: General - Other >> Dec 31, 2023  2:03 PM Fredrich Romans wrote: Reason for CRM: [employer] called in for information on patients A1C and diagnosis pertaining to [facility] 5 MG/0.5ML Pen medication.He is working on her prior authorization for this medication. Fax#:[phone] option #2

## 2023-12-31 NOTE — Telephone Encounter (Signed)
 Spoke with [employer] and they requested the last OV note and labs to be faxed to them at [phone] to assist with PA for mounjaro. Faxed requested information to number above.

## 2024-01-03 NOTE — Telephone Encounter (Signed)
 Copied from CRM [id number]. Topic: Clinical - Prescription Issue >> Jan 03, 2024  3:09 PM Suzette B wrote: Reason for NWG:NFAOZHY has called into to inform the provider that her insurance has provider her with a prior auth. Number for the [facility].  Th PA number is [id number]. Patient states if you needed to speak with her please call [phone]

## 2024-01-03 NOTE — Telephone Encounter (Signed)
 Copied from CRM [id number]. Topic: General - Other >> Jan 03, 2024 12:22 PM Jillian Fowler wrote: Reason for CRM: patient called checking on a medication prior authorization for mounjaro. Patient stated it was approved and she has the approval number but she has to go in her house to get it, so she will call back

## 2024-01-04 DIAGNOSIS — K642 Third degree hemorrhoids: Secondary | ICD-10-CM | POA: Diagnosis not present

## 2024-01-09 ENCOUNTER — Encounter: Payer: Self-pay | Admitting: Family Medicine

## 2024-01-13 NOTE — Telephone Encounter (Signed)
 Copied from CRM [id number]. Topic: Referral - Request for Referral >> Jan 13, 2024 10:19 AM [name] wrote: Patient is requesting referral for routine screening colonoscopy and sent a message on 01/09/24. Informed patient, 01/09/24 patient message. Patient does not remember having a colonoscopy or whom it would be with, patient will try to find out. However, patient still wants to have a routine screening for colonscopy, please advise and contact through MyChart.

## 2024-03-10 ENCOUNTER — Other Ambulatory Visit: Payer: Self-pay | Admitting: Family Medicine

## 2024-03-10 DIAGNOSIS — E7800 Pure hypercholesterolemia, unspecified: Secondary | ICD-10-CM

## 2024-03-30 ENCOUNTER — Other Ambulatory Visit: Payer: Self-pay | Admitting: Family Medicine

## 2024-03-30 DIAGNOSIS — Z1231 Encounter for screening mammogram for malignant neoplasm of breast: Secondary | ICD-10-CM

## 2024-04-05 ENCOUNTER — Encounter: Payer: Self-pay | Admitting: Family Medicine

## 2024-04-18 ENCOUNTER — Encounter: Payer: Self-pay | Admitting: Family Medicine

## 2024-04-18 ENCOUNTER — Ambulatory Visit (INDEPENDENT_AMBULATORY_CARE_PROVIDER_SITE_OTHER): Payer: Self-pay | Admitting: Family Medicine

## 2024-04-18 ENCOUNTER — Ambulatory Visit: Payer: Self-pay | Admitting: Family Medicine

## 2024-04-18 VITALS — BP 108/70 | HR 55 | Temp 96.5°F | Ht 62.0 in | Wt 183.6 lb

## 2024-04-18 DIAGNOSIS — E7800 Pure hypercholesterolemia, unspecified: Secondary | ICD-10-CM

## 2024-04-18 DIAGNOSIS — I10 Essential (primary) hypertension: Secondary | ICD-10-CM | POA: Diagnosis not present

## 2024-04-18 DIAGNOSIS — E1169 Type 2 diabetes mellitus with other specified complication: Secondary | ICD-10-CM

## 2024-04-18 DIAGNOSIS — Z794 Long term (current) use of insulin: Secondary | ICD-10-CM

## 2024-04-18 LAB — HEMOGLOBIN A1C: Hgb A1c MFr Bld: 6.6 % — ABNORMAL HIGH (ref 4.6–6.5)

## 2024-04-18 LAB — BASIC METABOLIC PANEL WITH GFR
BUN: 19 mg/dL (ref 6–23)
CO2: 32 meq/L (ref 19–32)
Calcium: 9.6 mg/dL (ref 8.4–10.5)
Chloride: 100 meq/L (ref 96–112)
Creatinine, Ser: 0.88 mg/dL (ref 0.40–1.20)
GFR: 68.45 mL/min (ref >60.00)
Glucose, Bld: 98 mg/dL (ref 70–99)
Potassium: 4.6 meq/L (ref 3.5–5.1)
Sodium: 138 meq/L (ref 135–145)

## 2024-04-18 LAB — LDL CHOLESTEROL, DIRECT: Direct LDL: 107 mg/dL

## 2024-04-18 MED ORDER — TIRZEPATIDE 7.5 MG/0.5ML ~~LOC~~ SOAJ: 7.5000 mg | SUBCUTANEOUS | 1 refills | Status: DC

## 2024-04-18 NOTE — Progress Notes (Addendum)
 Established Patient Office Visit   Subjective:  Patient ID: Jillian Fowler, female    DOB: 06/13/1956  Age: 68 y.o. MRN: [id number]  Chief Complaint  Patient presents with   Medical Management of Chronic Issues    3 month follow up. Pt is fasting.     HPI Encounter Diagnoses  Name Primary?   Type 2 diabetes mellitus with other specified complication, with long-term current use of insulin (HCC) Yes   Essential hypertension    Elevated LDL cholesterol level    Morbid obesity (HCC)    For follow-up of above.  Continues on medications except for the omeprazole.  She is exercising well over 150 minutes a week.  She is tolerating the tirzepatide  well.  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   Review of Systems  Constitutional: Negative.   HENT: Negative.    Eyes:  Negative for blurred vision, discharge and redness.  Respiratory: Negative.    Cardiovascular: Negative.   Gastrointestinal:  Negative for abdominal pain.  Genitourinary: Negative.   Musculoskeletal: Negative.  Negative for myalgias.  Skin:  Negative for rash.  Neurological:  Negative for tingling, loss of consciousness and weakness.  Endo/Heme/Allergies:  Negative for polydipsia.     Current Outpatient Medications:    citalopram  (CELEXA ) 20 MG tablet, Take 1 tablet (20 mg total) by mouth daily., Disp: 90 tablet, Rfl: 3   ferrous sulfate  325 (65 FE) MG tablet, Take 1 tablet (325 mg total) by mouth daily., Disp: 90 tablet, Rfl: 1   hydrocortisone  (ANUSOL -HC) 2.5 % rectal cream, Place 1 Application rectally 2 (two) times daily., Disp: 30 g, Rfl: 0   lisinopril -hydrochlorothiazide  (ZESTORETIC ) 20-25 MG tablet, Take 1 tablet by mouth daily., Disp: 90 tablet, Rfl: 3   metFORMIN  (GLUCOPHAGE ) 500 MG tablet, Take 2 tablets (1,000 mg total) by mouth 2 (two) times daily with a meal., Disp: 360 tablet, Rfl: 3   Multiple Vitamin (MULTI-VITAMIN) tablet, Take 1 tablet by mouth daily., Disp: 90  tablet, Rfl: 3   rosuvastatin  (CRESTOR ) 5 MG tablet, TAKE 1 TABLET BY MOUTH ONCE DAILY, Disp: 90 tablet, Rfl: 1   tirzepatide  (MOUNJARO ) 7.5 MG/0.5ML Pen, Inject 7.5 mg into the skin once a week., Disp: 6 mL, Rfl: 1   Objective:     BP 108/70 (Cuff Size: Normal)   Pulse (!) 55   Temp (!) 96.5 F (35.8 C) (Temporal)   Ht 5' 2 (1.575 m)   Wt 183 lb 9.6 oz (83.3 kg)   SpO2 98%   BMI 33.58 kg/m  Wt Readings from Last 3 Encounters:  04/18/24 183 lb 9.6 oz (83.3 kg)  10/15/23 189 lb 9.6 oz (86 kg)  08/30/23 188 lb 9.6 oz (85.5 kg)      Physical Exam Constitutional:      General: She is not in acute distress.    Appearance: Normal appearance. She is not ill-appearing, toxic-appearing or diaphoretic.  HENT:     Head: Normocephalic and atraumatic.     Right Ear: External ear normal.     Left Ear: External ear normal.     Mouth/Throat:     Mouth: Mucous membranes are moist.     Pharynx: Oropharynx is clear. No oropharyngeal exudate or posterior oropharyngeal erythema.   Eyes:     General: No scleral icterus.       Right eye: No discharge.        Left eye: No discharge.     Extraocular Movements:  Extraocular movements intact.     Conjunctiva/sclera: Conjunctivae normal.     Pupils: Pupils are equal, round, and reactive to light.    Cardiovascular:     Rate and Rhythm: Normal rate and regular rhythm.  Pulmonary:     Effort: Pulmonary effort is normal. No respiratory distress.     Breath sounds: Normal breath sounds. No wheezing or rales.  Abdominal:     General: Bowel sounds are normal.   Musculoskeletal:     Cervical back: No rigidity or tenderness.   Skin:    General: Skin is warm and dry.   Neurological:     Mental Status: She is alert and oriented to person, place, and time.   Psychiatric:        Mood and Affect: Mood normal.        Behavior: Behavior normal.      No results found for any visits on 04/18/24.    The 10-year ASCVD risk score (Arnett DK,  et al., 2019) is: 17.8%    Assessment & Plan:   Type 2 diabetes mellitus with other specified complication, with long-term current use of insulin (HCC) -     Hemoglobin A1c -     Basic metabolic panel with GFR -     Tirzepatide ; Inject 7.5 mg into the skin once a week.  Dispense: 6 mL; Refill: 1  Essential hypertension -     Basic metabolic panel with GFR  Elevated LDL cholesterol level -     LDL cholesterol, direct  Morbid obesity (HCC) -     Tirzepatide ; Inject 7.5 mg into the skin once a week.  Dispense: 6 mL; Refill: 1    Return in about 3 months (around 07/19/2024).  Have increased tirzepatide  to 7.5 mg weekly.  May need to increase rosuvastatin .  Jillian Frederic, MD

## 2024-04-18 NOTE — Patient Instructions (Signed)
 She has a long Pends Onset

## 2024-05-01 ENCOUNTER — Ambulatory Visit
Admission: RE | Admit: 2024-05-01 | Discharge: 2024-05-01 | Disposition: A | Discharge status: Home / Self Care | Source: Ambulatory Visit | Attending: Family Medicine | Admitting: Family Medicine

## 2024-05-01 DIAGNOSIS — Z1231 Encounter for screening mammogram for malignant neoplasm of breast: Secondary | ICD-10-CM | POA: Diagnosis not present

## 2024-05-22 NOTE — Progress Notes (Signed)
 [facility] - Clinic Note  05/23/2024     CHIEF COMPLAINT Patient presents for Retina Follow Up   HISTORY OF PRESENT ILLNESS: Jillian Fowler is a 68 y.o. female who presents to the clinic today for:   HPI     Retina Follow Up   In both eyes.  This started 1 year ago.  Duration of 1 year.  I, the attending physician,  performed the HPI with the patient and updated documentation appropriately.        Comments   1 year retina follow up DM exam pt is reporting no vision changes she denies any flashes she has some floaters her last A1C 6.4  she is not checking her blood sugar at this time       Last edited by Valdemar Rogue, MD on 05/28/2024  2:05 AM.     Pt states VA is stable. Seeing floaters, no FOL.   Referring physician: Berneta Elsie Sayre, MD [address] [place],  [place] [address]  HISTORICAL INFORMATION:   Selected notes from the MEDICAL RECORD NUMBER Referred by Dr. Berneta for diabetic eye exam LEE:  Ocular Hx- PMH-    CURRENT MEDICATIONS: No current outpatient medications on file. (Ophthalmic Drugs)   No current facility-administered medications for this visit. (Ophthalmic Drugs)   Current Outpatient Medications (Other)  Medication Sig   citalopram  (CELEXA ) 20 MG tablet Take 1 tablet (20 mg total) by mouth daily.   ferrous sulfate  325 (65 FE) MG tablet Take 1 tablet (325 mg total) by mouth daily.   lisinopril -hydrochlorothiazide  (ZESTORETIC ) 20-25 MG tablet Take 1 tablet by mouth daily.   metFORMIN  (GLUCOPHAGE ) 500 MG tablet Take 2 tablets (1,000 mg total) by mouth 2 (two) times daily with a meal.   Multiple Vitamin (MULTI-VITAMIN) tablet Take 1 tablet by mouth daily.   rosuvastatin  (CRESTOR ) 5 MG tablet TAKE 1 TABLET BY MOUTH ONCE DAILY   tirzepatide  (MOUNJARO ) 7.5 MG/0.5ML Pen Inject 7.5 mg into the skin once a week.   No current facility-administered medications for this visit. (Other)   REVIEW OF SYSTEMS: ROS    Positive for: Endocrine, Cardiovascular, Eyes Last edited by Resa Delon ORN, COT on 05/23/2024  8:37 AM.      ALLERGIES Allergies  Allergen Reactions   Atorvastatin     cramps   Simvastatin      Cramps    PAST MEDICAL HISTORY Past Medical History:  Diagnosis Date   Anxiety    Depression    Hyperlipidemia    Hypertension    Past Surgical History:  Procedure Laterality Date   BARIATRIC SURGERY  04/06/2022   EYE SURGERY  12/2020   UTERINE FIBROID SURGERY     FAMILY HISTORY Family History  Problem Relation Age of Onset   Hypertension Mother    Diabetes Mother    Arthritis Mother    Hypertension Father    Diabetes Father    SOCIAL HISTORY Social History   Tobacco Use   Smoking status: Never   Smokeless tobacco: Never  Vaping Use   Vaping status: Never Used  Substance Use Topics   Alcohol use: Never   Drug use: Never       OPHTHALMIC EXAM:  Base Eye Exam     Visual Acuity (Snellen - Linear)       Right Left   Dist ph [place] 20/20 20/20 -2         Tonometry (Tonopen, 8:43 AM)       Right  Left   Pressure 18 20         Pupils       Pupils Dark Light Shape React APD   Right PERRL 4 3 Round Brisk None   Left PERRL 4 3 Round Brisk None         Visual Fields       Left Right    Full Full         Extraocular Movement       Right Left    Full, Ortho Full, Ortho         Neuro/Psych     Oriented x3: Yes   Mood/Affect: Normal         Dilation     Both eyes: 2.5% Phenylephrine @ 8:43 AM           Slit Lamp and Fundus Exam     Slit Lamp Exam       Right Left   Lids/Lashes Dermatochalasis - upper lid Dermatochalasis - upper lid   Conjunctiva/Sclera mild melanosis mild melanosis   Cornea well healed cataract wound arcus, well healed cataract wound, mild tear film debris   Anterior Chamber deep and clear deep and clear   Iris Round and dilated, No NVI Round and dilated, No NVI   Lens PC IOL in good position PC IOL  in good position   Anterior Vitreous mild syneresis, Posterior vitreous detachment mild syneresis, Posterior vitreous detachment, vitreous condensations, Weiss ring         Fundus Exam       Right Left   Disc mild Pallor, Sharp rim, PPA/PPP Pink and Sharp, +PPP   C/D Ratio 0.4 0.5   Macula Flat, Good foveal reflex, RPE mottling, No heme or edema Flat, Good foveal reflex, RPE mottling, No heme or edema   Vessels attenuated, mild tortuosity, mild copper wiring mild attenuation, mild tortuosity, mild copper wiring, mild AV crossing changes   Periphery Attached, focal blot heme temporal periphery. Attached, No heme           Refraction     Wearing Rx       Sphere Cylinder Axis Add   Right -0.25 +0.50 166 +2.50   Left -0.75 +0.75 087 +2.50           IMAGING AND PROCEDURES  Imaging and Procedures for 05/23/2024  OCT, Retina - OU - Both Eyes       Right Eye Quality was good. Central Foveal Thickness: 256. Progression has been stable. Findings include normal foveal contour, no IRF, no SRF.   Left Eye Quality was good. Central Foveal Thickness: 261. Progression has been stable. Findings include normal foveal contour, no IRF, no SRF (No DME ).   Notes *Images captured and stored on drive  Diagnosis / Impression:  NFP, no IRF/SRF OU No DME OU  Clinical management:  See below  Abbreviations: NFP - Normal foveal profile. CME - cystoid macular edema. PED - pigment epithelial detachment. IRF - intraretinal fluid. SRF - subretinal fluid. EZ - ellipsoid zone. ERM - epiretinal membrane. ORA - outer retinal atrophy. ORT - outer retinal tubulation. SRHM - subretinal hyper-reflective material. IRHM - intraretinal hyper-reflective material            ASSESSMENT/PLAN:    ICD-10-CM   1. Diabetes mellitus type 2 without retinopathy (HCC)  E11.9 OCT, Retina - OU - Both Eyes    2. Diabetes mellitus treated with injections of non-insulin medication (HCC)  E11.9    Z79.85  3. Long term (current) use of oral hypoglycemic drugs  Z79.84     4. Essential hypertension  I10     5. Hypertensive retinopathy of both eyes  H35.033 OCT, Retina - OU - Both Eyes    6. Pseudophakia of both eyes  Z96.1      1-3. Diabetes mellitus, type 2 without retinopathy  - A1c: 6.6 on 6.17.25 - The incidence, risk factors for progression, natural history and treatment options for diabetic retinopathy  were discussed with patient.   - The need for close monitoring of blood glucose, blood pressure, and serum lipids, avoiding cigarette or any type of tobacco, and the need for long term follow up was also discussed with patient. - f/u in 1 year, sooner prn  4,5. Hypertensive retinopathy OU - discussed importance of tight BP control - monitor  6. Pseudophakia OU  - s/p CE/IOL OU (Dr. Regenia, 2022)  - IOLs in good position, doing well  - monitor  Ophthalmic Meds Ordered this visit:  No orders of the defined types were placed in this encounter.    Return in about 1 year (around 05/23/2025) for DM Exam, DFE, OCT.  There are no Patient Instructions on file for this visit.   Explained the diagnoses, plan, and follow up with the patient and they expressed understanding.  Patient expressed understanding of the importance of proper follow up care.   This document serves as a record of services personally performed by Redell JUDITHANN Hans, MD, PhD. It was created on their behalf by Auston Muzzy, COMT. The creation of this record is the provider's dictation and/or activities during the visit.  Electronically signed by: Auston Muzzy, COMT 05/28/24 2:08 AM  This document serves as a record of services personally performed by Redell JUDITHANN Hans, MD, PhD. It was created on their behalf by Almetta Pesa, an ophthalmic technician. The creation of this record is the provider's dictation and/or activities during the visit.    Electronically signed by: Almetta Pesa, OA, 05/28/24  2:08  AM  Redell JUDITHANN Hans, M.D., Ph.D. Diseases & Surgery of the Retina and Vitreous [facility]  I have reviewed the above documentation for accuracy and completeness, and I agree with the above. Redell JUDITHANN Hans, M.D., Ph.D. 05/28/24 2:08 AM   Abbreviations: M myopia (nearsighted); A astigmatism; H hyperopia (farsighted); P presbyopia; Mrx spectacle prescription;  CTL contact lenses; OD right eye; OS left eye; OU both eyes  XT exotropia; ET esotropia; PEK punctate epithelial keratitis; PEE punctate epithelial erosions; DES dry eye syndrome; MGD meibomian gland dysfunction; ATs artificial tears; PFAT's preservative free artificial tears; NSC nuclear sclerotic cataract; PSC posterior subcapsular cataract; ERM epi-retinal membrane; PVD posterior vitreous detachment; RD retinal detachment; DM diabetes mellitus; DR diabetic retinopathy; NPDR non-proliferative diabetic retinopathy; PDR proliferative diabetic retinopathy; CSME clinically significant macular edema; DME diabetic macular edema; dbh dot blot hemorrhages; CWS cotton wool spot; POAG primary open angle glaucoma; C/D cup-to-disc ratio; HVF humphrey visual field; GVF goldmann visual field; OCT optical coherence tomography; IOP intraocular pressure; BRVO Branch retinal vein occlusion; CRVO central retinal vein occlusion; CRAO central retinal artery occlusion; BRAO branch retinal artery occlusion; RT retinal tear; SB scleral buckle; PPV pars plana vitrectomy; VH Vitreous hemorrhage; PRP panretinal laser photocoagulation; IVK intravitreal kenalog ; VMT vitreomacular traction; MH Macular hole;  NVD neovascularization of the disc; NVE neovascularization elsewhere; AREDS age related eye disease study; ARMD age related macular degeneration; POAG primary open angle glaucoma; EBMD epithelial/anterior basement membrane dystrophy; ACIOL  anterior chamber intraocular lens; IOL intraocular lens; PCIOL posterior chamber intraocular lens; Phaco/IOL  phacoemulsification with intraocular lens placement; PRK photorefractive keratectomy; LASIK laser assisted in situ keratomileusis; HTN hypertension; DM diabetes mellitus; COPD chronic obstructive pulmonary disease

## 2024-05-23 ENCOUNTER — Ambulatory Visit (INDEPENDENT_AMBULATORY_CARE_PROVIDER_SITE_OTHER): Payer: Commercial Managed Care - PPO | Admitting: Ophthalmology

## 2024-05-23 ENCOUNTER — Encounter (INDEPENDENT_AMBULATORY_CARE_PROVIDER_SITE_OTHER): Payer: Self-pay | Admitting: Ophthalmology

## 2024-05-23 DIAGNOSIS — Z7984 Long term (current) use of oral hypoglycemic drugs: Secondary | ICD-10-CM | POA: Diagnosis not present

## 2024-05-23 DIAGNOSIS — I10 Essential (primary) hypertension: Secondary | ICD-10-CM

## 2024-05-23 DIAGNOSIS — H35033 Hypertensive retinopathy, bilateral: Secondary | ICD-10-CM

## 2024-05-23 DIAGNOSIS — Z7985 Long-term (current) use of injectable non-insulin antidiabetic drugs: Secondary | ICD-10-CM

## 2024-05-23 DIAGNOSIS — E119 Type 2 diabetes mellitus without complications: Secondary | ICD-10-CM | POA: Diagnosis not present

## 2024-05-23 DIAGNOSIS — Z961 Presence of intraocular lens: Secondary | ICD-10-CM | POA: Diagnosis not present

## 2024-05-28 ENCOUNTER — Encounter (INDEPENDENT_AMBULATORY_CARE_PROVIDER_SITE_OTHER): Payer: Self-pay | Admitting: Ophthalmology

## 2024-07-14 ENCOUNTER — Other Ambulatory Visit: Payer: Self-pay | Admitting: Family Medicine

## 2024-07-14 DIAGNOSIS — Z794 Long term (current) use of insulin: Secondary | ICD-10-CM

## 2024-07-14 MED ORDER — TIRZEPATIDE 7.5 MG/0.5ML ~~LOC~~ SOAJ: 7.5000 mg | SUBCUTANEOUS | 1 refills | Status: DC

## 2024-07-14 NOTE — Telephone Encounter (Signed)
 Copied from CRM #[id number]. Topic: Clinical - Medication Refill >> Jul 14, 2024 12:08 PM Roselie BROCKS wrote: Medication: MOUNJARO  5 MG/0.5ML Pen [[id number]]  Has the patient contacted their pharmacy? Yes (Agent: If no, request that the patient contact the pharmacy for the refill. If patient does not wish to contact the pharmacy document the reason why and proceed with request.) (Agent: If yes, when and what did the pharmacy advise?)  This is the patient's preferred pharmacy:  [facility] - [place], [place] - [address] [place] [address] Phone: [phone] Fax: [phone]   Is this the correct pharmacy for this prescription? Yes If no, delete pharmacy and type the correct one.   Has the prescription been filled recently? Yes  Is the patient out of the medication? Yes  Has the patient been seen for an appointment in the last year OR does the patient have an upcoming appointment? Yes  Can we respond through MyChart? Yes  Agent: Please be advised that Rx refills may take up to 3 business days. We ask that you follow-up with your pharmacy.

## 2024-09-14 ENCOUNTER — Other Ambulatory Visit: Payer: Self-pay | Admitting: Family Medicine

## 2024-09-14 DIAGNOSIS — E1169 Type 2 diabetes mellitus with other specified complication: Secondary | ICD-10-CM

## 2024-09-14 NOTE — Telephone Encounter (Signed)
 Contacted patient because she was due for a f/u in sept. Patient is scheduled to see Dr. Berneta Dec 30th at 9:20 am. Sent in her a 60 day supply to hold her for this month of nov and dec until her appointment. Patient expressed understanding. Nothing further needed.

## 2024-09-19 ENCOUNTER — Other Ambulatory Visit: Payer: Self-pay | Admitting: Family Medicine

## 2024-09-19 DIAGNOSIS — I10 Essential (primary) hypertension: Secondary | ICD-10-CM

## 2024-09-19 DIAGNOSIS — E1169 Type 2 diabetes mellitus with other specified complication: Secondary | ICD-10-CM

## 2024-09-19 DIAGNOSIS — F418 Other specified anxiety disorders: Secondary | ICD-10-CM

## 2024-09-22 ENCOUNTER — Other Ambulatory Visit: Payer: Self-pay

## 2024-09-22 ENCOUNTER — Ambulatory Visit: Payer: Self-pay

## 2024-09-22 DIAGNOSIS — F418 Other specified anxiety disorders: Secondary | ICD-10-CM

## 2024-09-22 MED ORDER — CITALOPRAM HYDROBROMIDE 20 MG PO TABS: 20.0000 mg | ORAL_TABLET | Freq: Every day | ORAL | 0 refills | Status: DC

## 2024-09-22 NOTE — Telephone Encounter (Signed)
 FYI Only or Action Required?: Action required by provider: Med Request.  Patient was last seen in primary care on 04/18/2024 by Berneta Elsie Sayre, MD.  Called Nurse Triage reporting Anxiety.  Symptoms began yesterday.  Interventions attempted: Rest, hydration, or home remedies.  Symptoms are: gradually worsening.  Triage Disposition: See PCP When Office is Open (Within 3 Days)  Patient/caregiver understands and will follow disposition?: Yes Reason for Disposition  MODERATE anxiety (e.g., persistent or frequent anxiety symptoms; interferes with sleep, school, or work)  Answer Assessment - Initial Assessment Questions Patient has a f/u previously scheduled for 12/30 with PCP. Patient requested refill on 11/18 and it was denied and is looking to get her medication until her appointment on 12/30. Please advise.    1. CONCERN: Did anything happen that prompted you to call today?      Out of citalopram  (CELEXA ) 20 MG tablet 2 days  2. ANXIETY SYMPTOMS: Can you describe how you (your loved one; patient) have been feeling? (e.g., tense, restless, panicky, anxious, keyed up, overwhelmed, sense of impending doom).      Overwhelmed, panicky   3. ONSET: How long have you been feeling this way? (e.g., hours, days, weeks)     Started feeling it yesterday, today is worse  4. SEVERITY: How would you rate the level of anxiety? (e.g., 0 - 10; or mild, moderate, severe).     8 or 9 now  5. FUNCTIONAL IMPAIRMENT: How have these feelings affected your ability to do daily activities? Have you had more difficulty than usual doing your normal daily activities? (e.g., getting better, same, worse; self-care, school, work, interactions)     Yes its affected, hard to make decisions  6. PATIENT SUPPORT: Who is with you now? Who do you live with? Do you have family or friends who you can talk to?        Lives alone, job is her life  7. OTHER SYMPTOMS: Do you have any other symptoms?  (e.g., feeling depressed, trouble concentrating, trouble sleeping, trouble breathing, palpitations or fast heartbeat, chest pain, sweating, nausea, or diarrhea)       Restless last night  Protocols used: Anxiety and Panic Attack-A-AH  Copied from CRM #[id number]. Topic: Clinical - Red Word Triage >> Sep 22, 2024  3:25 PM Drema MATSU wrote: Red Word that prompted transfer to Nurse Triage: Patient has an appointment for 12/30 and that is the next available. She is needing a 30 day supply of citalopram  (CELEXA ) 20 MG tablet  until her appointment. She is having symptoms from not having medication. She states that she is uneasy/ anxiety.

## 2024-09-22 NOTE — Telephone Encounter (Unsigned)
 Copied from CRM [id number]. Topic: Clinical - Prescription Issue >> Sep 22, 2024  8:04 AM Jillian Fowler wrote: Reason for CRM: Patient went into the pharmacy to pick up her citalopram  (CELEXA ) 20 MG tablet and was advised that this medication was denied. Agent can see that a request was put in on 11/18, but patient unsure why this was denied when she was last seen in June.

## 2024-09-22 NOTE — Telephone Encounter (Signed)
 Noted awaiting approval from provider per 09/22/24 telephone encounter

## 2024-10-05 ENCOUNTER — Other Ambulatory Visit: Payer: Self-pay | Admitting: Family Medicine

## 2024-10-05 DIAGNOSIS — E1169 Type 2 diabetes mellitus with other specified complication: Secondary | ICD-10-CM

## 2024-10-05 DIAGNOSIS — I10 Essential (primary) hypertension: Secondary | ICD-10-CM

## 2024-10-06 ENCOUNTER — Telehealth: Payer: Self-pay | Admitting: Family Medicine

## 2024-10-06 NOTE — Telephone Encounter (Signed)
 Copied from CRM [id number]. Topic: Clinical - Medication Refill >> Oct 06, 2024  8:54 AM Tanazia G wrote: Medication: lisinopril -hydrochlorothiazide  (ZESTORETIC ) 20-25 MG tablet Patient dropped medication down sink, is hoping for urgent refill.  Has the patient contacted their pharmacy? Yes (Agent: If no, request that the patient contact the pharmacy for the refill. If patient does not wish to contact the pharmacy document the reason why and proceed with request.) (Agent: If yes, when and what did the pharmacy advise?)  This is the patient's preferred pharmacy:  [facility] - [place], [place] - [address] [place] [address] Phone: [phone] Fax: [phone]   Is this the correct pharmacy for this prescription? Yes If no, delete pharmacy and type the correct one.   Has the prescription been filled recently? Yes  Is the patient out of the medication? Yes  Has the patient been seen for an appointment in the last year OR does the patient have an upcoming appointment? Yes  Can we respond through MyChart? Yes  Agent: Please be advised that Rx refills may take up to 3 business days. We ask that you follow-up with your pharmacy.

## 2024-10-25 ENCOUNTER — Encounter: Payer: Self-pay | Admitting: Podiatry

## 2024-10-25 ENCOUNTER — Ambulatory Visit: Admitting: Podiatry

## 2024-10-25 DIAGNOSIS — M2041 Other hammer toe(s) (acquired), right foot: Secondary | ICD-10-CM

## 2024-10-25 DIAGNOSIS — B070 Plantar wart: Secondary | ICD-10-CM

## 2024-10-25 NOTE — Progress Notes (Signed)
 Subjective:   Patient ID: Jillian Fowler, female   DOB: 68 y.o.   MRN: [id number]   HPI Patient presents with a painful callus this developed on the bottom of the left foot and may be a wart and also on the right foot has developed discomfort and pain to the fifth digit with bone structure issues   ROS      Objective:  Physical Exam  Neurovascular status intact with 2 separate problems with 1 being plantar keratotic tissue lesion left plantar foot that upon debridement shows pinpoint bleeding pain to lateral pressure and on the right fifth toe there is keratotic tissue formation with pain     Assessment:  Verruca plantaris plantar aspect left along with hammertoe deformity digit 5 right with pain     Plan:  H&P reviewed both condition Sharp sterile debridement lesion left apply chemical agent to create immune response and explained what to do if blistering were to occur and for the right did temporary debridement of lesions discussed arthroplasty and if symptoms come back quickly will be necessary

## 2024-10-31 ENCOUNTER — Encounter: Payer: Self-pay | Admitting: Family Medicine

## 2024-10-31 ENCOUNTER — Ambulatory Visit (INDEPENDENT_AMBULATORY_CARE_PROVIDER_SITE_OTHER): Admitting: Family Medicine

## 2024-10-31 VITALS — BP 111/67 | HR 66 | Temp 97.6°F | Ht 62.0 in | Wt 176.8 lb

## 2024-10-31 DIAGNOSIS — E1169 Type 2 diabetes mellitus with other specified complication: Secondary | ICD-10-CM | POA: Diagnosis not present

## 2024-10-31 DIAGNOSIS — Z794 Long term (current) use of insulin: Secondary | ICD-10-CM

## 2024-10-31 DIAGNOSIS — I10 Essential (primary) hypertension: Secondary | ICD-10-CM | POA: Diagnosis not present

## 2024-10-31 DIAGNOSIS — Z23 Encounter for immunization: Secondary | ICD-10-CM | POA: Diagnosis not present

## 2024-10-31 DIAGNOSIS — F418 Other specified anxiety disorders: Secondary | ICD-10-CM | POA: Diagnosis not present

## 2024-10-31 DIAGNOSIS — E7800 Pure hypercholesterolemia, unspecified: Secondary | ICD-10-CM | POA: Diagnosis not present

## 2024-10-31 DIAGNOSIS — E611 Iron deficiency: Secondary | ICD-10-CM

## 2024-10-31 DIAGNOSIS — D509 Iron deficiency anemia, unspecified: Secondary | ICD-10-CM

## 2024-10-31 LAB — MICROALBUMIN / CREATININE URINE RATIO
Creatinine,U: 90.6 mg/dL
Microalb Creat Ratio: UNDETERMINED mg/g (ref 0.0–30.0)
Microalb, Ur: <0.7 mg/dL

## 2024-10-31 LAB — LIPID PANEL
Cholesterol: 231 mg/dL — ABNORMAL HIGH (ref 28–200)
HDL: 50.6 mg/dL
LDL Cholesterol: 166 mg/dL — ABNORMAL HIGH (ref 10–99)
NonHDL: 179.92
Total CHOL/HDL Ratio: 5
Triglycerides: 71 mg/dL (ref 10.0–149.0)
VLDL: 14.2 mg/dL (ref 0.0–40.0)

## 2024-10-31 LAB — COMPREHENSIVE METABOLIC PANEL WITH GFR
ALT: 8 U/L (ref 3–35)
AST: 12 U/L (ref 5–37)
Albumin: 4.2 g/dL (ref 3.5–5.2)
Alkaline Phosphatase: 41 U/L (ref 39–117)
BUN: 19 mg/dL (ref 6–23)
CO2: 32 meq/L (ref 19–32)
Calcium: 9.1 mg/dL (ref 8.4–10.5)
Chloride: 101 meq/L (ref 96–112)
Creatinine, Ser: 0.73 mg/dL (ref 0.40–1.20)
GFR: 85.33 mL/min
Glucose, Bld: 86 mg/dL (ref 70–99)
Potassium: 4 meq/L (ref 3.5–5.1)
Sodium: 139 meq/L (ref 135–145)
Total Bilirubin: 0.4 mg/dL (ref 0.2–1.2)
Total Protein: 6.8 g/dL (ref 6.0–8.3)

## 2024-10-31 LAB — URINALYSIS, ROUTINE W REFLEX MICROSCOPIC
Bilirubin Urine: NEGATIVE
Hgb urine dipstick: NEGATIVE
Ketones, ur: NEGATIVE
Nitrite: NEGATIVE
RBC / HPF: NONE SEEN
Specific Gravity, Urine: 1.025 (ref 1.000–1.030)
Total Protein, Urine: NEGATIVE
Urine Glucose: NEGATIVE
Urobilinogen, UA: 0.2 (ref 0.0–1.0)
pH: 5.5 (ref 5.0–8.0)

## 2024-10-31 LAB — CBC
HCT: 37.3 % (ref 36.0–46.0)
Hemoglobin: 12.2 g/dL (ref 12.0–15.0)
MCHC: 32.7 g/dL (ref 30.0–36.0)
MCV: 71.4 fl — ABNORMAL LOW (ref 78.0–100.0)
Platelets: 257 K/uL (ref 150.0–400.0)
RBC: 5.23 Mil/uL — ABNORMAL HIGH (ref 3.87–5.11)
RDW: 13.6 % (ref 11.5–15.5)
WBC: 7.1 K/uL (ref 4.0–10.5)

## 2024-10-31 LAB — HEMOGLOBIN A1C: Hgb A1c MFr Bld: 6.3 % (ref 4.6–6.5)

## 2024-10-31 MED ORDER — TIRZEPATIDE 7.5 MG/0.5ML ~~LOC~~ SOAJ: 7.5000 mg | SUBCUTANEOUS | 1 refills | Status: AC

## 2024-10-31 MED ORDER — FERROUS SULFATE 325 (65 FE) MG PO TABS: 325.0000 mg | ORAL_TABLET | Freq: Every day | ORAL | 1 refills | Status: AC

## 2024-10-31 MED ORDER — LISINOPRIL-HYDROCHLOROTHIAZIDE 20-25 MG PO TABS: 1.0000 | ORAL_TABLET | Freq: Every day | ORAL | 0 refills | Status: AC

## 2024-10-31 MED ORDER — METFORMIN HCL 500 MG PO TABS: 1000.0000 mg | ORAL_TABLET | Freq: Two times a day (BID) | ORAL | 0 refills | Status: AC

## 2024-10-31 MED ORDER — ROSUVASTATIN CALCIUM 10 MG PO TABS: 10.0000 mg | ORAL_TABLET | Freq: Every day | ORAL | 3 refills | Status: AC

## 2024-10-31 MED ORDER — CITALOPRAM HYDROBROMIDE 20 MG PO TABS: 20.0000 mg | ORAL_TABLET | Freq: Every day | ORAL | 0 refills | Status: AC

## 2024-10-31 MED ORDER — MULTI-VITAMIN PO TABS: 1.0000 | ORAL_TABLET | Freq: Every day | ORAL | 3 refills | Status: AC

## 2024-10-31 NOTE — Progress Notes (Signed)
 "  Established Patient Office Visit   Subjective:  Patient ID: Jillian Fowler, female    DOB: [birth year]  Age: 68 y.o. MRN: [id number]  Chief Complaint  Patient presents with   Medical Management of Chronic Issues    6 mon f/u for DM Check. Pt is not fasting. No questions or concerns      HPI Encounter Diagnoses  Name Primary?   Elevated LDL cholesterol level Yes   Immunization due    Essential hypertension    Type 2 diabetes mellitus with other specified complication, with long-term current use of insulin (HCC)    Iron deficiency    For follow-up of above.  She is doing well with the tirzepatide .  Denies nausea diarrhea or constipation.  She has been able to lose some weight.  Diabetes has been well-controlled.  Blood pressure well-controlled with Zestoretic .  Last LDL was 111.  She continues on the low-dose rosuvastatin  without issue.  Denies general myalgias.  Does experience a cramp in her From time to time.   Review of Systems  Constitutional: Negative.   HENT: Negative.    Eyes:  Negative for blurred vision, discharge and redness.  Respiratory: Negative.    Cardiovascular: Negative.   Gastrointestinal:  Negative for abdominal pain.  Genitourinary: Negative.   Musculoskeletal: Negative.  Negative for myalgias.  Skin:  Negative for rash.  Neurological:  Negative for tingling, loss of consciousness and weakness.  Endo/Heme/Allergies:  Negative for polydipsia.      10/31/2024    9:35 AM 10/15/2023   10:10 AM 12/10/2022    9:00 AM  Depression screen PHQ 2/9  Decreased Interest 0 0 0  Down, Depressed, Hopeless 0 0 0  PHQ - 2 Score 0 0 0  Altered sleeping 0    Tired, decreased energy 0    Change in appetite 0    Feeling bad or failure about yourself  0    Trouble concentrating 0    Moving slowly or fidgety/restless 0    Suicidal thoughts 0    PHQ-9 Score 0    Difficult doing work/chores Not difficult at all  Not difficult at all      Current Medications[1]    Objective:     BP 111/67   Pulse 66   Temp 97.6 F (36.4 C)   Ht 5' 2 (1.575 m)   Wt 176 lb 12.8 oz (80.2 kg)   SpO2 96%   BMI 32.34 kg/m  Wt Readings from Last 3 Encounters:  10/31/24 176 lb 12.8 oz (80.2 kg)  04/18/24 183 lb 9.6 oz (83.3 kg)  10/15/23 189 lb 9.6 oz (86 kg)      Physical Exam Constitutional:      General: She is not in acute distress.    Appearance: Normal appearance. She is not ill-appearing, toxic-appearing or diaphoretic.  HENT:     Head: Normocephalic and atraumatic.     Right Ear: External ear normal.     Left Ear: External ear normal.  Eyes:     General: No scleral icterus.       Right eye: No discharge.        Left eye: No discharge.     Extraocular Movements: Extraocular movements intact.     Conjunctiva/sclera: Conjunctivae normal.  Pulmonary:     Effort: Pulmonary effort is normal. No respiratory distress.  Skin:    General: Skin is warm and dry.  Neurological:     Mental Status: She is alert and oriented to  person, place, and time.  Psychiatric:        Mood and Affect: Mood normal.        Behavior: Behavior normal.      No results found for any visits on 10/31/24.    The 10-year ASCVD risk score (Arnett DK, et al., 2019) is: 20%    Assessment & Plan:   Elevated LDL cholesterol level -     Comprehensive metabolic panel with GFR -     Lipid panel -     CT CARDIAC SCORING (SELF PAY ONLY); Future -     Rosuvastatin  Calcium ; Take 1 tablet (10 mg total) by mouth daily.  Dispense: 90 tablet; Refill: 3  Immunization due -     Flu vaccine HIGH DOSE PF(Fluzone Trivalent)  Essential hypertension -     Comprehensive metabolic panel with GFR -     CBC  Type 2 diabetes mellitus with other specified complication, with long-term current use of insulin (HCC) -     Comprehensive metabolic panel with GFR -     Hemoglobin A1c -     Urinalysis, Routine w reflex microscopic -     Microalbumin / creatinine urine ratio -     CT CARDIAC  SCORING (SELF PAY ONLY); Future -     Rosuvastatin  Calcium ; Take 1 tablet (10 mg total) by mouth daily.  Dispense: 90 tablet; Refill: 3  Iron deficiency -     Iron, TIBC and Ferritin Panel    Return in about 6 months (around 05/01/2025).  Discussed her higher risk of vascular disease with her history of diabetes hypertension and elevated cholesterol.  I have increased her rosuvastatin  to 10 mg.  She would like to go for coronary artery scoring.  Elsie Sim Lent, MD    [1]  Current Outpatient Medications:    citalopram  (CELEXA ) 20 MG tablet, Take 1 tablet (20 mg total) by mouth daily., Disp: 40 tablet, Rfl: 0   ferrous sulfate  325 (65 FE) MG tablet, Take 1 tablet (325 mg total) by mouth daily., Disp: 90 tablet, Rfl: 1   lisinopril -hydrochlorothiazide  (ZESTORETIC ) 20-25 MG tablet, TAKE 1 TABLET BY MOUTH EVERY DAY, Disp: 30 tablet, Rfl: 0   metFORMIN  (GLUCOPHAGE ) 500 MG tablet, Take 2 tablets (1,000 mg total) by mouth 2 (two) times daily with a meal., Disp: 240 tablet, Rfl: 0   Multiple Vitamin (MULTI-VITAMIN) tablet, Take 1 tablet by mouth daily., Disp: 90 tablet, Rfl: 3   rosuvastatin  (CRESTOR ) 10 MG tablet, Take 1 tablet (10 mg total) by mouth daily., Disp: 90 tablet, Rfl: 3   tirzepatide  (MOUNJARO ) 7.5 MG/0.5ML Pen, Inject 7.5 mg into the skin once a week., Disp: 6 mL, Rfl: 1  "

## 2024-11-01 LAB — IRON,TIBC AND FERRITIN PANEL
%SAT: 32 % (ref 16–45)
Ferritin: 62 ng/mL (ref 16–288)
Iron: 83 ug/dL (ref 45–160)
TIBC: 261 ug/dL (ref 250–450)

## 2024-11-03 ENCOUNTER — Ambulatory Visit: Payer: Self-pay | Admitting: Family Medicine

## 2024-11-03 NOTE — Progress Notes (Signed)
 Labs do reveal a significantly elevated LDL cholesterol.  It looks as though you have been out of your rosuvastatin .  Please start your new prescription and take it every day.  Please follow-up before you run out of medicine. Iron levels and stores of iron are both low.  Please continue iron therapy. Diabetes control has improved.

## 2024-11-15 ENCOUNTER — Ambulatory Visit: Admitting: Podiatry

## 2024-11-15 ENCOUNTER — Encounter: Payer: Self-pay | Admitting: Podiatry

## 2024-11-15 DIAGNOSIS — M2041 Other hammer toe(s) (acquired), right foot: Secondary | ICD-10-CM

## 2024-11-15 NOTE — Progress Notes (Signed)
 Subjective:   Patient ID: Jillian Fowler, female   DOB: 69 y.o.   MRN: [id number]   HPI Patient states the fifth toe has been killing her and she cannot wear shoe gear with any degree of comfort.  States she has tried cushioning shoes tried putting wider shoes and it is not working and she wants the toe fixed as soon as possible due to her schedule   ROS      Objective:  Physical Exam  Neurovascular status intact good digital perfusion painful fifth digit right rotated toe keratotic lesion very sore with difficulty wearing shoe gear     Assessment:  Chronic hammertoe deformity digit 5 right with deformity     Plan:  H&P reviewed recommended correction of deformity.  I explained procedure risk and recommended arthroplasty I allowed her to read a consent form after we went over this and she understands all complications outlined and is scheduled for outpatient surgery understanding total recovery.  Will take several months

## 2024-11-15 NOTE — Patient Instructions (Signed)

## 2024-11-16 ENCOUNTER — Telehealth: Payer: Self-pay | Admitting: Podiatry

## 2024-11-16 NOTE — Telephone Encounter (Signed)
 DOS- 11/21/2024  5TH HAMMERTOE REPAIR RT- 71714  [employer] EFFECTIVE DATE- 11/02/2024  PER CHANISSE WITH [employer], PRIOR AUTH IS NOT REQUIRED FOR CPT CODE 71714. REF# CHANISSE 11/16/2024 2:17PM

## 2024-11-16 NOTE — Telephone Encounter (Signed)
 Called and scheduled patient for surgery on 11/21/2024. Patient not on blood thinners and last GLP1 injection will be exactly one week prior to surgery.Patient pharmacy correct in chart.

## 2024-11-20 MED ORDER — HYDROCODONE-ACETAMINOPHEN 10-325 MG PO TABS: 1.0000 | ORAL_TABLET | Freq: Three times a day (TID) | ORAL | 0 refills | Status: AC | PRN

## 2024-11-20 NOTE — Addendum Note (Signed)
 Addended by: MAGDALEN PASCO RAMAN on: 11/20/2024 01:33 PM   Modules accepted: Orders

## 2024-11-21 DIAGNOSIS — M2041 Other hammer toe(s) (acquired), right foot: Secondary | ICD-10-CM | POA: Diagnosis not present

## 2024-11-27 ENCOUNTER — Encounter

## 2024-11-29 ENCOUNTER — Encounter

## 2024-11-29 ENCOUNTER — Ambulatory Visit: Admitting: Podiatry

## 2024-11-29 ENCOUNTER — Ambulatory Visit (INDEPENDENT_AMBULATORY_CARE_PROVIDER_SITE_OTHER)

## 2024-11-29 ENCOUNTER — Encounter: Payer: Self-pay | Admitting: Podiatry

## 2024-11-29 DIAGNOSIS — M2041 Other hammer toe(s) (acquired), right foot: Secondary | ICD-10-CM

## 2024-11-29 NOTE — Progress Notes (Signed)
 Subjective:   Patient ID: Jillian Fowler, female   DOB: 69 y.o.   MRN: [id number]   HPI Patient states doing very well very pleased   ROS      Objective:  Physical Exam  Neurovascular status intact negative T[place]a' sign noted wound edges coapted well digit in good alignment     Assessment:  Doing well post arthroplasty fifth digit right     Plan:  H&P x-rays taken reviewed indicating good alignment of the digits satisfactory resection of bone dressing applied reappoint 2 weeks suture removal earlier if needed

## 2024-12-11 ENCOUNTER — Encounter: Admitting: Podiatry

## 2024-12-19 ENCOUNTER — Other Ambulatory Visit (HOSPITAL_BASED_OUTPATIENT_CLINIC_OR_DEPARTMENT_OTHER)

## 2025-05-22 ENCOUNTER — Encounter (INDEPENDENT_AMBULATORY_CARE_PROVIDER_SITE_OTHER): Admitting: Ophthalmology
